# Patient Record
Sex: Female | Born: 1965 | Race: Black or African American | Hispanic: No | Marital: Single | Smoking: Former smoker
Health system: Southern US, Community
[De-identification: ages and names within clinical notes are randomized; demographics above are authoritative.]

## PROBLEM LIST (undated history)

## (undated) DIAGNOSIS — D571 Sickle-cell disease without crisis: Secondary | ICD-10-CM

## (undated) DIAGNOSIS — B379 Candidiasis, unspecified: Secondary | ICD-10-CM

## (undated) DIAGNOSIS — N3090 Cystitis, unspecified without hematuria: Secondary | ICD-10-CM

## (undated) DIAGNOSIS — Z9289 Personal history of other medical treatment: Secondary | ICD-10-CM

## (undated) DIAGNOSIS — J302 Other seasonal allergic rhinitis: Secondary | ICD-10-CM

## (undated) DIAGNOSIS — D573 Sickle-cell trait: Secondary | ICD-10-CM

## (undated) DIAGNOSIS — T7840XA Allergy, unspecified, initial encounter: Secondary | ICD-10-CM

## (undated) DIAGNOSIS — D649 Anemia, unspecified: Secondary | ICD-10-CM

## (undated) DIAGNOSIS — D219 Benign neoplasm of connective and other soft tissue, unspecified: Secondary | ICD-10-CM

## (undated) HISTORY — PX: TUBAL LIGATION: SHX77

## (undated) HISTORY — DX: Anemia, unspecified: D64.9

## (undated) HISTORY — DX: Sickle-cell disease without crisis: D57.1

## (undated) HISTORY — DX: Candidiasis, unspecified: B37.9

## (undated) HISTORY — PX: TONSILLECTOMY AND ADENOIDECTOMY: SHX28

## (undated) HISTORY — PX: WISDOM TOOTH EXTRACTION: SHX21

## (undated) HISTORY — DX: Allergy, unspecified, initial encounter: T78.40XA

## (undated) HISTORY — DX: Cystitis, unspecified without hematuria: N30.90

## (undated) HISTORY — DX: Benign neoplasm of connective and other soft tissue, unspecified: D21.9

## (undated) HISTORY — DX: Sickle-cell trait: D57.3

## (undated) HISTORY — PX: ABDOMINAL HYSTERECTOMY: SHX81

## (undated) HISTORY — PX: EYE MUSCLE SURGERY: SHX370

## (undated) SURGERY — Surgical Case
Anesthesia: *Unknown

---

## 1989-02-26 DIAGNOSIS — Z9289 Personal history of other medical treatment: Secondary | ICD-10-CM

## 1989-02-26 HISTORY — DX: Personal history of other medical treatment: Z92.89

## 1997-10-25 ENCOUNTER — Emergency Department (HOSPITAL_COMMUNITY): Admission: EM | Admit: 1997-10-25 | Discharge: 1997-10-25 | Payer: Self-pay | Admitting: Emergency Medicine

## 1997-10-26 ENCOUNTER — Encounter: Admission: RE | Admit: 1997-10-26 | Discharge: 1997-10-26 | Payer: Self-pay | Admitting: Sports Medicine

## 1999-07-21 ENCOUNTER — Other Ambulatory Visit: Admission: RE | Admit: 1999-07-21 | Discharge: 1999-07-21 | Payer: Self-pay | Admitting: Obstetrics and Gynecology

## 2000-09-11 ENCOUNTER — Encounter: Admission: RE | Admit: 2000-09-11 | Discharge: 2000-09-11 | Payer: Self-pay | Admitting: Family Medicine

## 2000-09-24 ENCOUNTER — Encounter: Admission: RE | Admit: 2000-09-24 | Discharge: 2000-09-24 | Payer: Self-pay | Admitting: Family Medicine

## 2001-04-02 ENCOUNTER — Other Ambulatory Visit: Admission: RE | Admit: 2001-04-02 | Discharge: 2001-04-02 | Payer: Self-pay | Admitting: Obstetrics and Gynecology

## 2002-07-14 ENCOUNTER — Other Ambulatory Visit: Admission: RE | Admit: 2002-07-14 | Discharge: 2002-07-14 | Payer: Self-pay | Admitting: Obstetrics and Gynecology

## 2003-03-15 ENCOUNTER — Encounter: Admission: RE | Admit: 2003-03-15 | Discharge: 2003-03-15 | Payer: Self-pay | Admitting: Family Medicine

## 2003-09-07 ENCOUNTER — Encounter: Admission: RE | Admit: 2003-09-07 | Discharge: 2003-09-07 | Payer: Self-pay | Admitting: Family Medicine

## 2003-09-08 ENCOUNTER — Other Ambulatory Visit: Admission: RE | Admit: 2003-09-08 | Discharge: 2003-09-08 | Payer: Self-pay | Admitting: Obstetrics and Gynecology

## 2003-10-26 ENCOUNTER — Ambulatory Visit (HOSPITAL_COMMUNITY): Admission: RE | Admit: 2003-10-26 | Discharge: 2003-10-26 | Payer: Self-pay | Admitting: Obstetrics and Gynecology

## 2003-10-27 ENCOUNTER — Encounter: Admission: RE | Admit: 2003-10-27 | Discharge: 2003-10-27 | Payer: Self-pay | Admitting: Family Medicine

## 2004-02-22 ENCOUNTER — Emergency Department (HOSPITAL_COMMUNITY): Admission: EM | Admit: 2004-02-22 | Discharge: 2004-02-22 | Payer: Self-pay | Admitting: Family Medicine

## 2004-07-04 ENCOUNTER — Ambulatory Visit: Payer: Self-pay | Admitting: Family Medicine

## 2005-01-16 ENCOUNTER — Other Ambulatory Visit: Admission: RE | Admit: 2005-01-16 | Discharge: 2005-01-16 | Payer: Self-pay | Admitting: Obstetrics and Gynecology

## 2005-03-16 ENCOUNTER — Ambulatory Visit: Payer: Self-pay | Admitting: Family Medicine

## 2005-03-19 ENCOUNTER — Ambulatory Visit: Payer: Self-pay | Admitting: Family Medicine

## 2005-10-01 ENCOUNTER — Ambulatory Visit: Payer: Self-pay | Admitting: Family Medicine

## 2006-08-05 ENCOUNTER — Telehealth (INDEPENDENT_AMBULATORY_CARE_PROVIDER_SITE_OTHER): Payer: Self-pay | Admitting: *Deleted

## 2007-06-23 ENCOUNTER — Encounter (INDEPENDENT_AMBULATORY_CARE_PROVIDER_SITE_OTHER): Payer: Self-pay | Admitting: *Deleted

## 2007-08-31 ENCOUNTER — Emergency Department (HOSPITAL_COMMUNITY): Admission: EM | Admit: 2007-08-31 | Discharge: 2007-08-31 | Payer: Self-pay | Admitting: Family Medicine

## 2008-02-09 ENCOUNTER — Telehealth: Payer: Self-pay | Admitting: *Deleted

## 2008-02-10 ENCOUNTER — Encounter (INDEPENDENT_AMBULATORY_CARE_PROVIDER_SITE_OTHER): Payer: Self-pay | Admitting: Family Medicine

## 2008-02-10 ENCOUNTER — Ambulatory Visit: Payer: Self-pay | Admitting: Family Medicine

## 2008-02-10 LAB — CONVERTED CEMR LAB
Blood in Urine, dipstick: NEGATIVE
Nitrite: NEGATIVE
Protein, U semiquant: NEGATIVE
Urobilinogen, UA: 0.2

## 2008-02-13 ENCOUNTER — Encounter: Payer: Self-pay | Admitting: *Deleted

## 2008-02-16 ENCOUNTER — Ambulatory Visit: Payer: Self-pay | Admitting: Family Medicine

## 2008-02-16 LAB — CONVERTED CEMR LAB
Chlamydia, DNA Probe: POSITIVE — AB
GC Probe Amp, Genital: NEGATIVE
HCV Ab: NEGATIVE
Hepatitis B Surface Ag: NEGATIVE

## 2009-02-05 ENCOUNTER — Telehealth: Payer: Self-pay | Admitting: Family Medicine

## 2010-01-04 ENCOUNTER — Ambulatory Visit: Payer: Self-pay | Admitting: Family Medicine

## 2010-01-04 DIAGNOSIS — R109 Unspecified abdominal pain: Secondary | ICD-10-CM | POA: Insufficient documentation

## 2010-01-06 ENCOUNTER — Encounter: Payer: Self-pay | Admitting: Family Medicine

## 2010-02-13 ENCOUNTER — Telehealth: Payer: Self-pay | Admitting: Family Medicine

## 2010-02-26 DIAGNOSIS — D219 Benign neoplasm of connective and other soft tissue, unspecified: Secondary | ICD-10-CM

## 2010-02-26 HISTORY — DX: Benign neoplasm of connective and other soft tissue, unspecified: D21.9

## 2010-03-28 NOTE — Assessment & Plan Note (Signed)
Summary: cpe,df   Vital Signs:  Patient profile:   45 year old female Height:      63 inches Weight:      129.2 pounds BMI:     22.97 Temp:     98.5 degrees F oral Pulse rate:   98 / minute BP sitting:   130 / 74  (left arm) Cuff size:   regular  Vitals Entered By: Garen Grams LPN (January 04, 2010 10:11 AM) CC: CPE Is Patient Diabetic? No Pain Assessment Patient in pain? no        CC:  CPE.  History of Present Illness: Abdominal Pain  has been told she has fibroids by her gyn.  Had an [place] that she was told showed small fibroids and that she needed a hysterctomy She has almost daiily lower abdominal pain.  Worse sometimes than others.   Has regular menstrual periods and pain does not seem related to them.  Pain sometimes seems to hurt in back also.  No gastrointestinal symptoms of nausea or vomiting weight loss or bleeding.  No fevers or sweats.  ROS - as above PMH - Medications reviewed and updated in medication list.  Smoking Status noted in VS form    Habits & Providers  Alcohol-Tobacco-Diet     Tobacco Status: current     Tobacco Counseling: to quit use of tobacco products     Cigarette Packs/Day: <0.25  Current Medications (verified): 1)  None  Allergies: 1)  ! Penicillin 2)  ! Hydrocodone 3)  ! Tramadol Hcl 4)  ! Darvocet  Social History: Smoking Status:  current Packs/Day:  <0.25  Physical Exam  General:  Well-developed,well-nourished,in no acute distress; alert,appropriate and cooperative throughout examination Abdomen:  Bowel sounds positive,abdomen soft tender over mid lower quadrants with palpable firm mass in midline seems to measure about 6-8 inches in width.  Not pulsatile or fluctuant Extremities:  No clubbing, cyanosis, edema, or deformity noted with normal full range of motion of all joints.  No pain in abdomen with hip movement   Impression & Recommendations:  Problem # 1:  ABDOMINAL PAIN (ICD-789.00)  seems to be consistent with  fibroids except lower abdominal mass is quite large.  Will request [place] results.  Does not have any red flags for gastrointestinal problems or infection.  Recommend she follow up with her gyn for further evaluation and possible treatment of fibroids   Orders: [facility]- Est Level  3 (16109)  Patient Instructions: 1)  I will call you in a few weeks and discuss the Ultrasound 2)  Consider coming back for a cholesterol check 3)  Tobacco is very bad for your health and your loved ones ! You should stop smoking !  4)  Stop smoking tips: Choose a quit date. Cut down before the quit date. Decide what you will do as a substitute when you feel the urge to smoke(gum, toothpick, exercise).    Orders Added: 1)  [facility]- Est Level  3 [60454]

## 2010-03-30 NOTE — Progress Notes (Signed)
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 Additional Follow-up by: De Nurse,  February 14, 2010 4:13 PM    Additional Follow-up for Phone Call Additional follow up Details #2::    Spokw with her reviewed [place] report of multiple fibroids - agreed with Dr Marcelle Overlie that hysterectomy is most likely the best apprpoach Follow-up by: Pearlean Brownie MD,  February 16, 2010 4:15 PM

## 2010-03-31 NOTE — Miscellaneous (Signed)
Summary: ROI  ROI   Imported By: Bradly Bienenstock 01/06/2010 16:51:17  _____________________________________________________________________  External Attachment:    Type:   Image     Comment:   External Document

## 2011-04-23 ENCOUNTER — Ambulatory Visit (INDEPENDENT_AMBULATORY_CARE_PROVIDER_SITE_OTHER): Payer: 59 | Admitting: Family Medicine

## 2011-04-23 ENCOUNTER — Encounter: Payer: Self-pay | Admitting: Family Medicine

## 2011-04-23 VITALS — BP 127/75 | HR 92 | Temp 98.0°F | Ht 63.0 in | Wt 143.0 lb

## 2011-04-23 DIAGNOSIS — Z131 Encounter for screening for diabetes mellitus: Secondary | ICD-10-CM

## 2011-04-23 DIAGNOSIS — D259 Leiomyoma of uterus, unspecified: Secondary | ICD-10-CM

## 2011-04-23 DIAGNOSIS — D219 Benign neoplasm of connective and other soft tissue, unspecified: Secondary | ICD-10-CM

## 2011-04-23 DIAGNOSIS — Z13220 Encounter for screening for lipoid disorders: Secondary | ICD-10-CM

## 2011-04-23 LAB — CBC WITH DIFFERENTIAL/PLATELET
Basophils Absolute: 0 10*3/uL (ref 0.0–0.1)
Basophils Relative: 1 % (ref 0–1)
Eosinophils Absolute: 0.3 10*3/uL (ref 0.0–0.7)
Eosinophils Relative: 7 % — ABNORMAL HIGH (ref 0–5)
Lymphocytes Relative: 40 % (ref 12–46)
MCH: 25.8 pg — ABNORMAL LOW (ref 26.0–34.0)
MCHC: 31.8 g/dL (ref 30.0–36.0)
MCV: 81 fL (ref 78.0–100.0)
Monocytes Relative: 6 % (ref 3–12)
Neutrophils Relative %: 47 % (ref 43–77)
Platelets: 333 10*3/uL (ref 150–400)
RBC: 4.11 MIL/uL (ref 3.87–5.11)
RDW: 16.9 % — ABNORMAL HIGH (ref 11.5–15.5)
WBC: 4.6 10*3/uL (ref 4.0–10.5)

## 2011-04-23 LAB — LIPID PANEL
Cholesterol: 173 mg/dL (ref 0–200)
HDL: 57 mg/dL (ref >39)
LDL Cholesterol: 102 mg/dL — ABNORMAL HIGH (ref 0–99)
Total CHOL/HDL Ratio: 3 Ratio
Triglycerides: 68 mg/dL (ref <150)

## 2011-04-23 LAB — GLUCOSE, CAPILLARY: Glucose-Capillary: 96 mg/dL (ref 70–99)

## 2011-04-23 NOTE — Assessment & Plan Note (Signed)
Following with Dr Marcelle Overlie.  Given her ice craving and likelihood of anemia will check cbc

## 2011-04-23 NOTE — Progress Notes (Signed)
  Subjective:    Patient ID: Sheri Shepard, female    DOB: 04-19-1965, 46 y.o.   MRN: [id number]  HPI  Here for CPE  Except for a mild cold feels well  Fibroids - following with Dr Marcelle Overlie -saw last about a year ago.  Still having abdomen fullness and heavy menstrual periods. Does crave ice  FH - several sibliing have diabetes ,Review of Systems  Patient reports no  vision/ hearing changes,anorexia, weight change, fever ,adenopathy, persistant / recurrent hoarseness, swallowing issues, chest pain, edema,persistant / recurrent cough, hemoptysis, dyspnea(rest, exertional, paroxysmal nocturnal), gastrointestinal  bleeding (melena, rectal bleeding), abdominal pain, excessive heart burn, GU symptoms(dysuria, hematuria, pyuria, voiding/incontinence  Issues) syncope, focal weakness, severe memory loss, concerning skin lesions, depression, anxiety, abnormal bruising/bleeding, major joint swelling    Objective:   Physical Exam Neck:  No deformities, thyromegaly, masses, or tenderness noted.   Supple with full range of motion without pain. Ears:  External ear exam shows no significant lesions or deformities.  Otoscopic examination reveals clear canals, tympanic membranes are intact bilaterally without bulging, retraction, inflammation or discharge. Hearing is grossly normal bilaterall Lungs:  Normal respiratory effort, chest expands symmetrically. Lungs are clear to auscultation, no crackles or wheezes. Heart - Regular rate and rhythm.  No murmurs, gallops or rubs.    Abdomen: soft and non-tender  no organomegaly or hernias noted.  No guarding or rebound.  Does have a large firm mass in suprapubic area consistent with fibroids Extremities:  No cyanosis, edema, or deformity noted with good range of motion of all major joints.   Skin:  Intact without suspicious lesions or rashes  Does have a benign pigmented nevus on upper left chest          Assessment & Plan:    Health Maintenance - will  check lipids and blood sugar.   Advised regular exercise.

## 2011-04-23 NOTE — Patient Instructions (Addendum)
You should exercise for 20-30 min 5 x a week  Your weight is good  I will call you if your tests are not good.  Otherwise I will send you a letter.  If you do not hear from me with in 2 weeks please call our office.     Use Afrin nasal spray for no more than three days to help with congestion

## 2011-04-26 ENCOUNTER — Encounter: Payer: Self-pay | Admitting: Family Medicine

## 2011-04-30 ENCOUNTER — Telehealth: Payer: Self-pay | Admitting: Family Medicine

## 2011-04-30 NOTE — Telephone Encounter (Signed)
Patient got her results on Friday and wants to speak to someone about the results.

## 2011-05-01 NOTE — Telephone Encounter (Signed)
Left voicemail will try to call her later today

## 2011-05-01 NOTE — Telephone Encounter (Signed)
Pt is calling again today - wants to know when doctor will call her

## 2011-05-01 NOTE — Telephone Encounter (Signed)
Spoke with her explained need for iron We got cut off near the end

## 2011-05-01 NOTE — Telephone Encounter (Signed)
Finished explanation and she agreed

## 2011-10-19 ENCOUNTER — Encounter: Payer: Self-pay | Admitting: Obstetrics and Gynecology

## 2011-11-05 ENCOUNTER — Encounter: Payer: Self-pay | Admitting: Obstetrics and Gynecology

## 2011-11-05 ENCOUNTER — Ambulatory Visit (INDEPENDENT_AMBULATORY_CARE_PROVIDER_SITE_OTHER): Payer: 59 | Admitting: Obstetrics and Gynecology

## 2011-11-05 VITALS — BP 122/66 | Ht 63.0 in | Wt 152.0 lb

## 2011-11-05 DIAGNOSIS — D219 Benign neoplasm of connective and other soft tissue, unspecified: Secondary | ICD-10-CM | POA: Insufficient documentation

## 2011-11-05 DIAGNOSIS — D259 Leiomyoma of uterus, unspecified: Secondary | ICD-10-CM

## 2011-11-05 DIAGNOSIS — D573 Sickle-cell trait: Secondary | ICD-10-CM | POA: Insufficient documentation

## 2011-11-05 DIAGNOSIS — N3090 Cystitis, unspecified without hematuria: Secondary | ICD-10-CM

## 2011-11-05 DIAGNOSIS — B49 Unspecified mycosis: Secondary | ICD-10-CM

## 2011-11-05 DIAGNOSIS — B379 Candidiasis, unspecified: Secondary | ICD-10-CM | POA: Insufficient documentation

## 2011-11-05 NOTE — Progress Notes (Signed)
  Current contraception: tubal ligation. Hormone replacement therapy: No New medication: No  History of ZOX:WRUE  History of infertility: no. History of abnormal Pap smear: no  Due October 2013 History of fibroids: Yes 2012  Increased stress: No  Abnormal bleeding pattern started: pt states that she is having severe pain with periods. Lower abdominal pain, cramps in legs and nausea. States that she was dx. With fibroids in 2012. Pt states that Dr. Marcelle Overlie with [facility] recommended partial hysterectomy.   Subjective:    Sheri Shepard is a 46 y.o. female, [phone], who presents for worsening dysmenorrhea since June 2013, onset 1-2 days prior to bleeding, lasting the whole cycle. Intensity 8/10: Ibuprofen 800 mg = 6/10. Pain radiate to legs. Associated with nausea but no vomiting. Non-smoker. Previous BCP user: nausea. No DVT.  Cycle is monthly, 5-7 days, heavy flow for 6 days: 1 pad every 90 minutes, no clots. No BTB. No PCB. Deep dyspareunia with all IC. Known with anemia.  Pt desires robotic hysterectomy. 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  The following portions of the patient's history were reviewed and updated as appropriate: allergies, current medications, past family history.  Review of Systems Pertinent items are noted in HPI. Breast:Negative for breast lump,nipple discharge or nipple retraction Gastrointestinal: Negative for abdominal pain, change in bowel habits or rectal bleeding. Occasional constipation. Urinary: frequency, incomplete emptying. No dysuria. Occasional USI with sneezing.   Objective:    BP 122/66  Ht 5\' 3"  (1.6 m)  Wt 152 lb (68.947 kg)  BMI 26.93 kg/m2  LMP 10/18/2011    Weight:  Wt Readings from Last 1 Encounters:  11/05/11 152 lb (68.947 kg)          BMI: Body mass index is 26.93 kg/(m^2).  General Appearance: Alert, appropriate appearance for age. No acute distress HEENT: Grossly normal Neck / Thyroid: Supple, no  masses, nodes or enlargement Lungs: clear to auscultation bilaterally Back: No CVA tenderness Cardiovascular: Regular rate and rhythm. S1, S2, no murmur Gastrointestinal: Uterus 22-24 weeks and tender on right Pelvic Exam: Uterus: enlarged and non mobile Rectovaginal: not indicated    Assessment:    Symptomatic fibroids in pt desiring robotic hysterectomy    Plan:    Pelvic ultrasound ordered Info given on Depo-Lupron

## 2011-11-05 NOTE — Patient Instructions (Signed)
Robotic hysterectomy Depo-Lupron for fibroids

## 2011-11-06 ENCOUNTER — Telehealth: Payer: Self-pay | Admitting: Obstetrics and Gynecology

## 2011-11-06 MED ORDER — PROMETHAZINE HCL 25 MG PO TABS: 25.0000 mg | ORAL_TABLET | Freq: Four times a day (QID) | ORAL | Status: DC | PRN

## 2011-11-06 NOTE — Telephone Encounter (Signed)
Pt requesting something for nausea. Sent Phenergan 25 mg, 1 q 6hr PRN nausea.  #30 no refills per nausea protocol (SR)  ld

## 2011-11-21 ENCOUNTER — Ambulatory Visit (INDEPENDENT_AMBULATORY_CARE_PROVIDER_SITE_OTHER): Payer: 59 | Admitting: Obstetrics and Gynecology

## 2011-11-21 ENCOUNTER — Ambulatory Visit (INDEPENDENT_AMBULATORY_CARE_PROVIDER_SITE_OTHER): Payer: 59

## 2011-11-21 ENCOUNTER — Encounter: Payer: Self-pay | Admitting: Obstetrics and Gynecology

## 2011-11-21 VITALS — BP 110/72 | Wt 152.0 lb

## 2011-11-21 DIAGNOSIS — D259 Leiomyoma of uterus, unspecified: Secondary | ICD-10-CM

## 2011-11-21 DIAGNOSIS — M549 Dorsalgia, unspecified: Secondary | ICD-10-CM

## 2011-11-21 DIAGNOSIS — D219 Benign neoplasm of connective and other soft tissue, unspecified: Secondary | ICD-10-CM

## 2011-11-21 NOTE — Patient Instructions (Signed)
Depo-Lupron Robotic hysterectomy

## 2011-11-21 NOTE — Progress Notes (Signed)
Subjective:  Pt stated having some lower back pain and thin clear discharge . Pt stated no other issues today.   Sheri Shepard is a 46 y.o. female, [phone], who presents for Gyn ultrasound because of Fibroids. Pt desires robotic hysterectomy  The following portions of the patient's history were reviewed and updated as appropriate: allergies, current medications, past family history.  Objective:    BP 110/72  Wt 152 lb (68.947 kg)  LMP 10/18/2011    Weight:  Wt Readings from Last 1 Encounters:  11/21/11 152 lb (68.947 kg)          BMI: There is no height on file to calculate BMI.  ULTRASOUND: Uterus 12x11x8 cm    Adnexa normal x2    Endometrium 8  mm    Free fluid: no    Other findings:  5 measurable fibroids with largest at 7 cm left subserosal. No pedunculated.  Abdomen: 20 weeks size Pelvic: Uterus non-mobile about 20 weeks    Assessment:    symptomatic fibroids    Plan:    Recommend depo-lupron for 3-6 months to increase chances of success Will use add-back and follow-up with ultrasound to determine need for 2nd injection

## 2011-11-22 ENCOUNTER — Telehealth: Payer: Self-pay | Admitting: Obstetrics and Gynecology

## 2011-11-22 NOTE — Telephone Encounter (Signed)
sr pt 

## 2011-11-23 ENCOUNTER — Telehealth: Payer: Self-pay | Admitting: Obstetrics and Gynecology

## 2011-11-23 ENCOUNTER — Encounter: Payer: Self-pay | Admitting: Obstetrics and Gynecology

## 2011-11-23 MED ORDER — LEUPROLIDE ACETATE (3 MONTH) 11.25 MG IM KIT: 11.2500 mg | PACK | INTRAMUSCULAR | Status: DC

## 2011-11-23 NOTE — Progress Notes (Unsigned)
RX for Depot Lupron called to [place] at [facility]  [phone]. Pt to be notified regarding copay. Office to be notified prior to shipping. Per Dr SR injection  to be administered with menses and negative UPT.  Pt to have add-back Angelique. Notify Dr SR after 1st dose. To have U/S in 12 weeks at time of next injection which may be cancelled. Surgery to follow treatment.

## 2011-11-23 NOTE — Telephone Encounter (Signed)
TC to pt. Informed to expect call regarding copay for Lupron. Once approved, to call 1st day of menses to schedule appt. Pt verbalizes comprehension.

## 2011-11-23 NOTE — Telephone Encounter (Signed)
TC to pt. Explained that a dip urine was done but no culture. Pt agreeable.  To call if symptoms reoccur.  ld

## 2011-11-23 NOTE — Telephone Encounter (Signed)
Message copied by Mason Jim on Fri Nov 23, 2011  9:55 AM ------      Message from: Silverio Lay      Created: Wed Nov 21, 2011  4:14 PM      Regarding: Depo-Lupron       Depo-Lupron Orders            Dosage:  11.25 mg  to give  With cycle q 12 weeks x 2            Diagnosis: large symptomatic fibroids            UPT: yes            Add-back hormonotherapy: yes. Provide with Angeliq            Follow-up appointment: yes Needs ultrasound at 12 weeks on day of second injection which may be cancelled            Surgery to follow treatment: yes            Please send message back to Dr Estanislado Pandy when First dose given to patient

## 2011-11-23 NOTE — Telephone Encounter (Signed)
TC to pt. LM to return call.  

## 2011-11-27 ENCOUNTER — Other Ambulatory Visit: Payer: Self-pay | Admitting: Obstetrics and Gynecology

## 2011-11-27 DIAGNOSIS — D219 Benign neoplasm of connective and other soft tissue, unspecified: Secondary | ICD-10-CM

## 2011-12-05 ENCOUNTER — Telehealth: Payer: Self-pay | Admitting: Obstetrics and Gynecology

## 2011-12-05 NOTE — Telephone Encounter (Signed)
Returned pt's call. LM to return call.   

## 2011-12-05 NOTE — Telephone Encounter (Signed)
Ns to follow up

## 2011-12-06 NOTE — Telephone Encounter (Signed)
Returned pt's call. States has not heard for pharmacy regarding copay.  Tc to [facility] at [phone]. States awaiting Dx code.  Dx of Fibroids given again. Pt to be notified of copay.  TC to pt. Informed of above. Pt verbalizes comprehension.

## 2011-12-10 ENCOUNTER — Telehealth: Payer: Self-pay | Admitting: Obstetrics and Gynecology

## 2011-12-10 NOTE — Telephone Encounter (Signed)
TC from pt.  States has still not heard about status of Lupron. LMP today.  Phone numbers given to pt for [facility] for pt  to call. Also states is taking Ibuprofen 800 mg 1-2 times during menses but does not totally relieve pain.   Suggested take q 8 hours.  To call if no relief.  Pt verbalizes comprehension.

## 2011-12-10 NOTE — Telephone Encounter (Signed)
Returned pt's call.  States she spoke with [facility] at [phone].  Has made payment arrangements and wants Lupron shipped. Tc to [facility] at same #. Rx will be shipped 12/11/11 and arrive 12/12/11. Tc to pt. LM

## 2011-12-10 NOTE — Telephone Encounter (Signed)
Returned pt's call. LM to return call.   

## 2011-12-11 ENCOUNTER — Other Ambulatory Visit: Payer: 59

## 2011-12-13 ENCOUNTER — Other Ambulatory Visit (INDEPENDENT_AMBULATORY_CARE_PROVIDER_SITE_OTHER): Payer: 59

## 2011-12-13 DIAGNOSIS — D259 Leiomyoma of uterus, unspecified: Secondary | ICD-10-CM

## 2011-12-13 MED ORDER — LEUPROLIDE ACETATE (3 MONTH) 11.25 MG IM KIT
11.2500 mg | PACK | Freq: Once | INTRAMUSCULAR | Status: AC
  Administered 2011-12-13: 11.25 mg via INTRAMUSCULAR

## 2011-12-13 NOTE — Progress Notes (Unsigned)
Informed pt that Depo Lupron can cause bone loss.  Inst pt to take Calcium and gave pt samples and coupons.

## 2011-12-31 ENCOUNTER — Telehealth: Payer: Self-pay | Admitting: Obstetrics and Gynecology

## 2011-12-31 NOTE — Telephone Encounter (Signed)
Pt called regarding msg below.  States received the injection on 10/17 and was on cycle @ the time.  Pt wants to know if she stopped her cycle does the medication cause bleeding again.  Informed pt that the Lupron can cause her to have irregular bldg initially.  Pt denies having to change a soaked pad Qhr, states her bldg is actually better than it was before.  Per SR note, will have pt come back into office for an Korea to evaluate and if needed will get the 2nd injection, pt voices agreement.  Will call back with any concerns.

## 2012-01-07 ENCOUNTER — Telehealth: Payer: Self-pay | Admitting: Obstetrics and Gynecology

## 2012-01-07 NOTE — Telephone Encounter (Signed)
sr pt 

## 2012-01-07 NOTE — Telephone Encounter (Signed)
Pt questioning when she will be due for f/u u/s.  According to note,  Depo Lupron for 3-6 months.  Pt had inj on 12-13-11.  Will route to Sr for instruction.  ld

## 2012-01-09 NOTE — Telephone Encounter (Signed)
Please schedule ultrasound 12 weeks after her Depo-Lupron injection

## 2012-01-15 ENCOUNTER — Other Ambulatory Visit: Payer: Self-pay | Admitting: Obstetrics and Gynecology

## 2012-01-15 DIAGNOSIS — D219 Benign neoplasm of connective and other soft tissue, unspecified: Secondary | ICD-10-CM

## 2012-01-15 NOTE — Telephone Encounter (Signed)
Tc TO pt.  Per Dr SR scheduled for U/S and F/U 03/05/12 which will be 12 weeks after receiving Lupron.

## 2012-01-15 NOTE — Telephone Encounter (Signed)
Did you get this?

## 2012-02-13 ENCOUNTER — Encounter: Payer: Self-pay | Admitting: Family Medicine

## 2012-02-13 ENCOUNTER — Ambulatory Visit (INDEPENDENT_AMBULATORY_CARE_PROVIDER_SITE_OTHER): Payer: 59 | Admitting: Family Medicine

## 2012-02-13 VITALS — BP 143/92 | HR 102 | Temp 98.5°F | Ht 63.0 in | Wt 154.0 lb

## 2012-02-13 DIAGNOSIS — D229 Melanocytic nevi, unspecified: Secondary | ICD-10-CM | POA: Insufficient documentation

## 2012-02-13 DIAGNOSIS — D239 Other benign neoplasm of skin, unspecified: Secondary | ICD-10-CM

## 2012-02-13 NOTE — Patient Instructions (Addendum)
Follow up with Dr Marcelle Overlie for the Pap Smear  If you would like to have the mole removed then come back   Watch the calories and rich foods over the holidays  Consider trying to lose about 2 lbs a week to get back to your normal weight

## 2012-02-13 NOTE — Progress Notes (Signed)
  Subjective:    Patient ID: Sheri Shepard, female    DOB: 06-May-1965, 46 y.o.   MRN: [id number]  HPI  Mole Wants to discuss pros and cons of removal.  Has been on her left chest for years - no changing in size or bleeding or color.  Does occasionally itch and will hit the seatbelt sometime  No fhx of skin cancer   Review of Systems     Objective:   Physical Exam  Left anterior chest black homogenous in color stuck on appearing mole approximately 8 mm in diameter with clear well defined borders      Assessment & Plan:

## 2012-02-13 NOTE — Assessment & Plan Note (Signed)
No red flags other than size which has not been changing.   We discussed a simple shave removal and check to make sure is not cancer.  She would like to think about it and reschedule if she decides to have it remvoed.  Discussed if any changes it should be removed as could be cancer

## 2012-02-25 ENCOUNTER — Ambulatory Visit (INDEPENDENT_AMBULATORY_CARE_PROVIDER_SITE_OTHER): Payer: 59 | Admitting: Family Medicine

## 2012-02-25 ENCOUNTER — Encounter: Payer: Self-pay | Admitting: Family Medicine

## 2012-02-25 VITALS — BP 155/79 | HR 97 | Temp 98.6°F | Ht 63.0 in | Wt 153.7 lb

## 2012-02-25 DIAGNOSIS — J069 Acute upper respiratory infection, unspecified: Secondary | ICD-10-CM

## 2012-02-25 NOTE — Progress Notes (Signed)
Subjective:   1. URI symptoms-patient with 4 days of sore throat, headache, cough, nasal congestion and rhinorrhea, and body aches. Symptoms seemed to peak/plateau yesterday. May have had fever over weekend and has occasional chill. No nausea/vomiting. Does have some decreased appetite. Body aches most annoying symptom. Using tylenol cold OR Dayquil and occasional motrin and cough drops. Has tried heating pad and rest over the weekend but went to work today and started to feel worse when pushing herself. Positive sick contact-son with viral illness.   ROS--See HPI with addition of some right ear fullness Past Medical History . Sickle cell trait  Overall generally healthy, did not receive flu vaccine this year and does not desire  Medications- reviewed and updated Chief complaint-noted  Objective: BP 155/79  Pulse 97  Temp 98.6 F (37 C)  Ht 5\' 3"  (1.6 m)  Wt 153 lb 11.2 oz (69.718 kg)  BMI 27.23 kg/m2  LMP 12/14/2011 Gen: NAD, overweight, pleasant Eyes: conjunctivae and cornea slightly red, PERRLA, EOMI.  Ears: normal TM and external ear canal left ear and abnormal TM right ear - serous middle ear fluid Nose: clear discharge, mild congestion, turbinates red, swollen, no sinus tenderness Throat: lips, mucosa, and tongue normal; teeth and gums normal Neck: no adenopathy and supple, symmetrical, trachea midline CV: RRR no mrg Lungs: CTAB, no wheeze, rale, rhonchi Ext: no edema  Assessment/Plan: See problem oriented charted  Elevated BP-patient using decongestants which couldbe cause for elevated BP but noted to be high at last visit. WIll have patient check at home and follow up with Dr. Deirdre Priest if elevated.

## 2012-02-25 NOTE — Patient Instructions (Addendum)
I am sorry you arent feeling well. I think you have an upper respiratory infection with associated body aches.  Since the body aches are bothering you the most, I would like you to take 400mg  Motrin every 6 hours while awake. The heating pad is great idea as well. Use tylenol cold as needed but do not use anything else with acetaminophen or tylenol.  I would like you to rest for the next 2 days, you should slowly continue to improve but symptoms from a cold can last up to 2 weeks. If you are notn improving, please return to care here.   Your blood pressure was up again today. When you are feeling better and not using cold medicine, check it at home or [facility] and if above 140/90 please return to a visit with Dr. Deirdre Priest.   Thanks so much for your time,  Dr. Durene Cal

## 2012-02-26 DIAGNOSIS — J069 Acute upper respiratory infection, unspecified: Secondary | ICD-10-CM | POA: Insufficient documentation

## 2012-02-26 NOTE — Assessment & Plan Note (Addendum)
Viral illness with positive sick contact. No red flags. Symptomatic treatment per AVS including rest. GIven work note for 2 days.

## 2012-03-05 ENCOUNTER — Encounter: Payer: 59 | Admitting: Obstetrics and Gynecology

## 2012-03-05 ENCOUNTER — Encounter: Payer: Self-pay | Admitting: Obstetrics and Gynecology

## 2012-03-05 ENCOUNTER — Ambulatory Visit (INDEPENDENT_AMBULATORY_CARE_PROVIDER_SITE_OTHER): Payer: 59

## 2012-03-05 ENCOUNTER — Ambulatory Visit (INDEPENDENT_AMBULATORY_CARE_PROVIDER_SITE_OTHER): Payer: 59 | Admitting: Obstetrics and Gynecology

## 2012-03-05 ENCOUNTER — Other Ambulatory Visit: Payer: Self-pay | Admitting: Obstetrics and Gynecology

## 2012-03-05 VITALS — BP 100/60 | Ht 63.0 in | Wt 154.0 lb

## 2012-03-05 DIAGNOSIS — D259 Leiomyoma of uterus, unspecified: Secondary | ICD-10-CM

## 2012-03-05 DIAGNOSIS — D219 Benign neoplasm of connective and other soft tissue, unspecified: Secondary | ICD-10-CM

## 2012-03-05 NOTE — Progress Notes (Signed)
Subjective:    Sheri Shepard is a 47 y.o. female, [phone], who presents for Gyn ultrasound because of Fibroids S/P Lupron. Amenorrhea since October with complete resolution of pelvic pain and back pain. Did not use add-back. Reports hot flashes and night sweats that are tolerable.  The following portions of the patient's history were reviewed and updated as appropriate: allergies, current medications, past family history.  Objective:    LMP 12/14/2011    Weight:  Wt Readings from Last 1 Encounters:  02/25/12 153 lb 11.2 oz (69.718 kg)          BMI: There is no height or weight on file to calculate BMI.  ULTRASOUND: Uterus Enlarged 10.7 x 9 x 6 cm   From 11/2011: 12 x 11 x 8 cm    Adnexa Normal    Endometrium 0.543 mm    Free fluid: no    Other findings:  Multiple fibroids, measured in same order as previous scan for comparison: Fibroid 1. Posterior RT fundal: 4.4 x 4.6 x 4.6 cm  unchanged Fibroid 2. Anterior RT fundal: 3.6 x 3.5 x 4.1 cm  unchanged Fibroid 3. Left subserosal: 6.4 x 4.4 x 5.5 cm  Decreased from 8.2 cm Fibroid 4. Posterior Intramural: 2.5 x 1.7 x 2.2 cm  Decreased from 4.7. cm Fibroid 5. Anterior Intramural: 2.4 x 2.4 x 2.5 cm  Decreased from 3.2 cm  Exam: abdomen with fundal height at 0/2   Assessment:    Fibroids with some reduction in volume after 3 months of Depo-Lupron    Plan:    Repeat Depo-Lupron Schedule robotic hysterectomy mid- April Follow-up at pre-op appointment with ultrasound   Silverio Lay MD

## 2012-03-10 ENCOUNTER — Telehealth: Payer: Self-pay | Admitting: Obstetrics and Gynecology

## 2012-03-10 NOTE — Telephone Encounter (Signed)
Advised pt that this is normal. States that she has spoke with company regarding Depo Lupron inj. And we are just waiting for it to be delivered to office  [name], CMA

## 2012-03-10 NOTE — Telephone Encounter (Signed)
LM for pt to return call  [name], CMA

## 2012-03-12 ENCOUNTER — Ambulatory Visit: Payer: 59 | Admitting: Family Medicine

## 2012-03-14 ENCOUNTER — Encounter: Payer: 59 | Admitting: Obstetrics and Gynecology

## 2012-03-18 ENCOUNTER — Telehealth: Payer: Self-pay | Admitting: Obstetrics and Gynecology

## 2012-03-19 NOTE — Telephone Encounter (Signed)
sr pt 

## 2012-03-20 NOTE — Telephone Encounter (Signed)
Spoke with [facility] [phone]) states that they are sending Depo-Lupron inj [employer], shipping today. Should receive tomorrow 03/21/2012.  Pt voiced understanding   [name], CMA

## 2012-03-27 ENCOUNTER — Telehealth: Payer: Self-pay | Admitting: Obstetrics and Gynecology

## 2012-03-27 NOTE — Telephone Encounter (Signed)
sr pt 

## 2012-03-27 NOTE — Telephone Encounter (Signed)
Pt scheduled for 03/28/12 @ 3:00  [name], CMA

## 2012-03-28 ENCOUNTER — Other Ambulatory Visit: Payer: 59

## 2012-03-28 DIAGNOSIS — N912 Amenorrhea, unspecified: Secondary | ICD-10-CM

## 2012-03-28 LAB — POCT URINE PREGNANCY: Preg Test, Ur: NEGATIVE

## 2012-03-28 MED ORDER — LEUPROLIDE ACETATE (3 MONTH) 11.25 MG IM KIT
11.2500 mg | PACK | Freq: Once | INTRAMUSCULAR | Status: AC
  Administered 2012-03-28: 11.25 mg via INTRAMUSCULAR

## 2012-04-30 ENCOUNTER — Encounter: Payer: Self-pay | Admitting: Family Medicine

## 2012-04-30 ENCOUNTER — Ambulatory Visit (INDEPENDENT_AMBULATORY_CARE_PROVIDER_SITE_OTHER): Payer: 59 | Admitting: Family Medicine

## 2012-04-30 VITALS — BP 137/85 | HR 87 | Temp 98.2°F | Ht 63.0 in | Wt 156.0 lb

## 2012-04-30 DIAGNOSIS — M25519 Pain in unspecified shoulder: Secondary | ICD-10-CM | POA: Insufficient documentation

## 2012-04-30 DIAGNOSIS — M25512 Pain in left shoulder: Secondary | ICD-10-CM

## 2012-04-30 MED ORDER — IBUPROFEN 400 MG PO TABS: 400.0000 mg | ORAL_TABLET | Freq: Four times a day (QID) | ORAL | Status: DC | PRN

## 2012-04-30 NOTE — Progress Notes (Signed)
Subjective:     Patient ID: Sheri Shepard, female   DOB: Feb 02, 1966, 47 y.o.   MRN: [id number]  Shoulder Pain  The pain is present in the left shoulder. This is a new problem. The current episode started 1 to 4 weeks ago (C/o left shoulder pain for 3 wks or more). There has been a history of trauma (She fell 2 wks ago ago on the snow,but already hurting before she fell,but it made pain worse). The problem has been waxing and waning (last episode was last night). The quality of the pain is described as sharp. Pain scale: Today she does not have any pain,but whenever she has pain it is about 7/10. The pain is moderate. Associated symptoms include stiffness and tingling. Pertinent negatives include no fever, inability to bear weight, joint swelling or numbness. The symptoms are aggravated by activity (Combing her hair aggravates her pain). She has tried NSAIDS for the symptoms. The treatment provided no relief.   Past Medical History  Diagnosis Date   Yeast infection    Fibroids 2012   Sickle cell trait    Bladder infection      Review of Systems  Constitutional: Negative for fever.  Respiratory: Negative.   Cardiovascular: Negative.   Musculoskeletal: Positive for arthralgias and stiffness.       Shoulder pain  Neurological: Positive for tingling. Negative for numbness.  All other systems reviewed and are negative.   Filed Vitals:   04/30/12 0838  BP: 137/85  Pulse: 87  Temp: 98.2 F (36.8 C)  TempSrc: Oral  Height: 5\' 3"  (1.6 m)  Weight: 156 lb (70.761 kg)       Objective:   Physical Exam  Nursing note and vitals reviewed. Constitutional: She is oriented to person, place, and time. She appears well-developed. No distress.  Cardiovascular: Normal rate, regular rhythm, normal heart sounds and intact distal pulses.   No murmur heard. Pulmonary/Chest: Effort normal and breath sounds normal. No respiratory distress. She has no wheezes.  Musculoskeletal:       Right  shoulder: Normal. She exhibits normal range of motion and no tenderness.       Left shoulder: She exhibits tenderness. She exhibits normal range of motion.       Arms: Neurological: She is alert and oriented to person, place, and time.       Assessment/Plan:

## 2012-04-30 NOTE — Assessment & Plan Note (Signed)
Left shoulder pain. Musculoskeletal pain secondary to past trauma.May also be related to Lupron. Symptom improving currently asymptomatic. She had completed her last dose of Lupron,patient is to monitor for pain improvement. Ibuprofen 400 mg prescribed prn pain. She has F/U with dr Deirdre Priest in 2 wks.

## 2012-04-30 NOTE — Patient Instructions (Signed)
Sheri Shepard, Lotter do cause joint disorder and bone lose over a period of time,but you have only been on this for a short duration.  Let us have you use Ibuprofen prn shoulder pain for now.  Since you already had your last dose of Leuprone,let us monitor for pain improvement.  F/U in 2-3 wks for reassessment.

## 2012-05-14 ENCOUNTER — Encounter: Payer: 59 | Admitting: Family Medicine

## 2012-06-27 ENCOUNTER — Encounter (HOSPITAL_COMMUNITY): Payer: Self-pay | Admitting: Pharmacist

## 2012-06-27 ENCOUNTER — Other Ambulatory Visit (HOSPITAL_COMMUNITY): Payer: Self-pay | Admitting: Obstetrics and Gynecology

## 2012-06-27 NOTE — H&P (Signed)
Sheri Shepard is a 47 y.o. female [phone]  for Robot Assisted Hysterectomy because of symptomatic uterine fibroids.  Patient has known of her fibroids for years but a year ago she developed bothersome symptoms that became life altering.  Her 7 day menses required a pad change every 90 minutes accompanied by cramps (rated 10/10 on a 10 point pain scale) that radiated to her back and down her legs.  Fortunately she was able to get some relief with Ibuprofen decreasing her pain to 6/10.  She goes on to report dyspareunia and constipation but denies any urinary tract symptoms, irregular bleeding or vaginitis symptoms.  A pelvic ultrasound in September 2013 showed: uterus-12 x 11 x 8 cm with #5 fibroids the largest measuring 7 cm.   She was placed on Lupron Depot for 3 months resulting in  relief of her pelvic discomfort and amenorrhea.  A pelvic ultrasound, January 2014 showed: uterus-10.7 x 9.0 x 6.0 cm with #5 fibroids: post. right fundal-4.4 x 4.6 x 4.6 cm; ant. right fundal-3.6 x 3.5 x 4.1 cm; left subserosal-6.4 x 4.4 x 5.5 cm (decrease from 8.2 cm); post. intramural-2.5 x 1.7 x 2.2 cm (decrease from 4.7 cm) and ant. intramural-2.4 x 2.4 x 2.5 cm. Follow up ultrasound 06/26/2012 showed uterus: 9.84 x 7.88 x 8.13 cm, endometrium-3.56 mm and #4 fibroids: post. fundal-3.6 x 4.8 x 4.5 cm; ant. fundal-3.0 x 3.4 x 3.5 cm; left subserosal-6.3 x 5.3 x 5.9 cm and ant. intramural-1.9 x 2.2 x 2.1 cm;  the previously seen 5th fibroid was not visible. Right ovary measured-2.52 x 1.96 x 1.36 cm and left ovary-2.44 x 1.81 x 1.60 cm.   The patient has had some occasional constipation and nausea, but no bladder, vaginitis or irregular bleeding symptoms.  A discussion was held with patient to review medical and surgical management options available for managing her fibroids.  After long and careful consideration she has decided to proceed with definitive therapy in the form of  a Robot Assisted Hysterectomy due to her  commitments requiring her to recover as soon as possible   Past Medical History  OB History: [id number] ;  #3 C-sections, 1988, 1991 and 1994  GYN History: menarche: 47 YO;  [id number] due to Lupron Depot    Contracepton: Tubal Sterilization; Has a remote history of Chlamydia;    Denies history of abnormal PAP smear  Last PAP smear: 02/2012  Medical History: anemia and uterine fibroids  Surgical History:  Childhood-Eye Muscle Surgery;  1989-Tonsillectomy and 1994-Tubal Sterilization Denies problems with anesthesia but had a  blood transfusion during 1991 pregnancy  Family History:  Diabetes mellitus, renal failure, stroke, anemia  Social History: 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 Tramadol Hcl Shortness Of Breath and Itching  . Hydrocodone Itching  . Penicillins Hives and Itching  . Propoxyphene-Acetaminophen Itching    Denies sensitivity to peanuts, shellfish, soy, latex or adhesives.  ROS: Admits to wearing glasses and aches in shoulders and legs;  Denies headache, vision changes, nasal congestion, dysphagia, tinnitus, dizziness, hoarseness, cough,  chest pain, shortness of breath, nausea, vomiting, diarrhea,constipation,  urinary frequency, urgency  dysuria, hematuria, vaginitis symptoms, pelvic pain, swelling of joints,easy bruising, skin rashes, unexplained weight loss and except as is mentioned in the history of present illness, patient's review of systems is otherwise negative  Physical Exam  Bp  124/90  P 80  R  14  Weight: 164lbs.   Height: 5'3"  T  97.8 degrees F orally  Neck: supple without masses or thyromegaly Lungs: clear to auscultation Heart: regular rate and rhythm Abdomen: firm mass from pelvis to approximately 4 fingers below umbilicus and no  organomegaly Pelvic:EGBUS- wnl; vagina-normal rugae; uterus-16-18 weeks size, irregular, cervix without lesions or motion tenderness; adnexae-no tenderness or masses Extremities:  no clubbing, cyanosis or edema   Assesment: Symptomatic Uterine Fibroids   Disposition:  The patient was given the indication for her procedure(s) along with the risks, which include but are not limited to, reaction to anesthesia, damage to adjacent organs, Infection, excessive bleeding, formation of scar tissue, early menopause, pelvic prolapse and the possible need for an open abdominal incision. She was further advised that she will experience transient post operative facial edema, that her hospital stay is expected to be 0-2 days, she should be able to return to her usual activities within 2-3 weeks (except intercourse to be delayed until 6 weeks) and that the robotic approach to her surgery requires more time to perform than an open abdominal approach.  Patient was given the Miralax bowel prep to be completed 24 hours prior to procedure.  She verbalized understanding of these risks and pre-operative instructions and has consented to proceed with a Robot Assisted Total Laparoscopic Hysterectomy  with Bilateral Salpingectomy, possible Supra-Cervical Hysterectomy at [facility] [place],  Jul 10, 2012 at 7:30 A.Judie Petit  CSN# [id number]   Sheri Shepard J. Lowell Guitar, PA-C  for Dr. Crist Fat.Rivard

## 2012-07-03 ENCOUNTER — Encounter (HOSPITAL_COMMUNITY): Payer: Self-pay

## 2012-07-03 ENCOUNTER — Encounter (HOSPITAL_COMMUNITY)
Admission: RE | Admit: 2012-07-03 | Discharge: 2012-07-03 | Disposition: A | Discharge status: Home / Self Care | Payer: 59 | Source: Ambulatory Visit | Attending: Obstetrics and Gynecology | Admitting: Obstetrics and Gynecology

## 2012-07-03 HISTORY — DX: Personal history of other medical treatment: Z92.89

## 2012-07-03 HISTORY — DX: Other seasonal allergic rhinitis: J30.2

## 2012-07-03 LAB — CBC
HCT: 38.8 % (ref 36.0–46.0)
Hemoglobin: 13.1 g/dL (ref 12.0–15.0)
MCH: 28.5 pg (ref 26.0–34.0)
MCHC: 33.8 g/dL (ref 30.0–36.0)
MCV: 84.5 fL (ref 78.0–100.0)
Platelets: 319 10*3/uL (ref 150–400)
RBC: 4.59 MIL/uL (ref 3.87–5.11)
RDW: 13.4 % (ref 11.5–15.5)

## 2012-07-03 LAB — SURGICAL PCR SCREEN: MRSA, PCR: NEGATIVE

## 2012-07-03 NOTE — Patient Instructions (Addendum)
   Your procedure is scheduled on: Thursday, May 15  Enter through the Main Entrance of [facility] at: 6 am Pick up the phone at the desk and dial [phone] and inform us of your arrival.  Please call this number if you have any problems the morning of surgery: [phone]  Remember: Do not eat or drink after midnight: Wednesday Take these medicines the morning of surgery with a SIP OF WATER:  None  Do not wear jewelry, make-up, or FINGER nail polish No metal in your hair or on your body. Do not wear lotions, powders, perfumes. You may wear deodorant.  Please use your CHG wash as directed prior to surgery.  Do not shave anywhere for at least 12 hours prior to first CHG shower.  Do not bring valuables to the hospital. Contacts, dentures or bridgework may not be worn into surgery.  Leave suitcase in the car. After Surgery it may be brought to your room. For patients being admitted to the hospital, checkout time is 11:00am the day of discharge.  Patients discharged on the day of surgery will not be allowed to drive home.  To be arranged prior to surgery.

## 2012-07-09 ENCOUNTER — Telehealth: Payer: Self-pay | Admitting: Obstetrics and Gynecology

## 2012-07-09 MED ORDER — CIPROFLOXACIN IN D5W 400 MG/200ML IV SOLN
400.0000 mg | INTRAVENOUS | Status: AC
  Administered 2012-07-10: 400 mg via INTRAVENOUS
  Filled 2012-07-09: qty 200

## 2012-07-09 MED ORDER — CLINDAMYCIN PHOSPHATE 900 MG/50ML IV SOLN
900.0000 mg | INTRAVENOUS | Status: AC
  Administered 2012-07-10: 900 mg via INTRAVENOUS
  Filled 2012-07-09: qty 50

## 2012-07-09 NOTE — Telephone Encounter (Signed)
Patient called and informed about FDA warning on use of morcellator dated 06/12/2012.  Discussed: 1. Incidence of post-operative diagnosis of sarcoma in women undergoing hysterectomy is 2:1000 2. Annual Incidence of leiomyosarcoma is 0.64/100,000 women 3. Sarcomas have a highest incidence in women over 65 4. Power morcellation involves risk of spreading tissue. In the case of undiagnosed cancer, it may adversely affect the patient's prognosis. 5. There is no conclusive evidence that manual morcellation during an abdominal and / or vaginal  Hysterectomy eliminates those risks  Patient voices understanding and desires to proceed as planned

## 2012-07-10 ENCOUNTER — Ambulatory Visit (HOSPITAL_COMMUNITY)
Admission: RE | Admit: 2012-07-10 | Discharge: 2012-07-11 | Disposition: A | Discharge status: Home / Self Care | Payer: 59 | Source: Ambulatory Visit | Attending: Obstetrics and Gynecology | Admitting: Obstetrics and Gynecology

## 2012-07-10 ENCOUNTER — Encounter (HOSPITAL_COMMUNITY): Payer: Self-pay | Admitting: *Deleted

## 2012-07-10 ENCOUNTER — Encounter (HOSPITAL_COMMUNITY): Payer: Self-pay | Admitting: Anesthesiology

## 2012-07-10 ENCOUNTER — Ambulatory Visit (HOSPITAL_COMMUNITY): Payer: 59 | Admitting: Anesthesiology

## 2012-07-10 ENCOUNTER — Encounter (HOSPITAL_COMMUNITY)
Admission: RE | Disposition: A | Discharge status: Home / Self Care | Payer: Self-pay | Source: Ambulatory Visit | Attending: Obstetrics and Gynecology

## 2012-07-10 DIAGNOSIS — N72 Inflammatory disease of cervix uteri: Secondary | ICD-10-CM | POA: Insufficient documentation

## 2012-07-10 DIAGNOSIS — D251 Intramural leiomyoma of uterus: Secondary | ICD-10-CM | POA: Insufficient documentation

## 2012-07-10 DIAGNOSIS — D250 Submucous leiomyoma of uterus: Secondary | ICD-10-CM | POA: Insufficient documentation

## 2012-07-10 DIAGNOSIS — N840 Polyp of corpus uteri: Secondary | ICD-10-CM | POA: Insufficient documentation

## 2012-07-10 DIAGNOSIS — IMO0002 Reserved for concepts with insufficient information to code with codable children: Secondary | ICD-10-CM | POA: Insufficient documentation

## 2012-07-10 HISTORY — PX: ROBOTIC ASSISTED TOTAL HYSTERECTOMY: SHX6085

## 2012-07-10 LAB — COMPREHENSIVE METABOLIC PANEL
AST: 19 U/L (ref 0–37)
BUN: 7 mg/dL (ref 6–23)
CO2: 26 mEq/L (ref 19–32)
Calcium: 10 mg/dL (ref 8.4–10.5)
Creatinine, Ser: 0.67 mg/dL (ref 0.50–1.10)
GFR calc non Af Amer: >90 mL/min (ref >90)

## 2012-07-10 LAB — PREGNANCY, URINE: Preg Test, Ur: NEGATIVE

## 2012-07-10 SURGERY — ROBOTIC ASSISTED TOTAL HYSTERECTOMY
Anesthesia: General | Site: Abdomen | Laterality: Bilateral | Wound class: Clean Contaminated

## 2012-07-10 MED ORDER — FENTANYL CITRATE 0.05 MG/ML IJ SOLN
INTRAMUSCULAR | Status: AC
  Filled 2012-07-10: qty 2

## 2012-07-10 MED ORDER — PROPOFOL 10 MG/ML IV EMUL
INTRAVENOUS | Status: AC
  Filled 2012-07-10: qty 20

## 2012-07-10 MED ORDER — LIDOCAINE HCL (CARDIAC) 20 MG/ML IV SOLN
INTRAVENOUS | Status: DC | PRN
  Administered 2012-07-10: 100 mg via INTRAVENOUS

## 2012-07-10 MED ORDER — FENTANYL CITRATE 0.05 MG/ML IJ SOLN
INTRAMUSCULAR | Status: DC | PRN
  Administered 2012-07-10 (×5): 50 ug via INTRAVENOUS

## 2012-07-10 MED ORDER — GLYCOPYRROLATE 0.2 MG/ML IJ SOLN
INTRAMUSCULAR | Status: DC | PRN
  Administered 2012-07-10: .5 mg via INTRAVENOUS

## 2012-07-10 MED ORDER — MIDAZOLAM HCL 5 MG/5ML IJ SOLN
INTRAMUSCULAR | Status: DC | PRN
  Administered 2012-07-10: 2 mg via INTRAVENOUS

## 2012-07-10 MED ORDER — DEXAMETHASONE SODIUM PHOSPHATE 10 MG/ML IJ SOLN
INTRAMUSCULAR | Status: AC
  Filled 2012-07-10: qty 1

## 2012-07-10 MED ORDER — DEXAMETHASONE SODIUM PHOSPHATE 4 MG/ML IJ SOLN
INTRAMUSCULAR | Status: DC | PRN
  Administered 2012-07-10: 10 mg via INTRAVENOUS

## 2012-07-10 MED ORDER — KETOROLAC TROMETHAMINE 30 MG/ML IJ SOLN
INTRAMUSCULAR | Status: DC | PRN
  Administered 2012-07-10: 30 mg via INTRAVENOUS

## 2012-07-10 MED ORDER — ROPIVACAINE HCL 5 MG/ML IJ SOLN
INTRAMUSCULAR | Status: DC | PRN
  Administered 2012-07-10: 120 mL

## 2012-07-10 MED ORDER — PROPOFOL 10 MG/ML IV BOLUS
INTRAVENOUS | Status: DC | PRN
  Administered 2012-07-10: 160 mg via INTRAVENOUS

## 2012-07-10 MED ORDER — HYDROCODONE-ACETAMINOPHEN 5-325 MG PO TABS: 1.0000 | ORAL_TABLET | Freq: Four times a day (QID) | ORAL | Status: DC | PRN

## 2012-07-10 MED ORDER — ONDANSETRON HCL 4 MG/2ML IJ SOLN
INTRAMUSCULAR | Status: AC
  Filled 2012-07-10: qty 2

## 2012-07-10 MED ORDER — MENTHOL 3 MG MT LOZG
1.0000 | LOZENGE | OROMUCOSAL | Status: DC | PRN
  Administered 2012-07-10: 3 mg via ORAL
  Filled 2012-07-10: qty 9

## 2012-07-10 MED ORDER — IBUPROFEN 600 MG PO TABS: 600.0000 mg | ORAL_TABLET | Freq: Four times a day (QID) | ORAL | Status: DC | PRN

## 2012-07-10 MED ORDER — LACTATED RINGERS IV SOLN
INTRAVENOUS | Status: DC
  Administered 2012-07-10: 1000 mL via INTRAVENOUS
  Administered 2012-07-10 (×2): via INTRAVENOUS

## 2012-07-10 MED ORDER — ONDANSETRON HCL 4 MG PO TABS: 4.0000 mg | ORAL_TABLET | Freq: Three times a day (TID) | ORAL | Status: DC | PRN

## 2012-07-10 MED ORDER — METOCLOPRAMIDE HCL 5 MG/ML IJ SOLN
10.0000 mg | Freq: Once | INTRAMUSCULAR | Status: DC | PRN
Start: 2012-07-10 — End: 2012-07-10

## 2012-07-10 MED ORDER — FENTANYL CITRATE 0.05 MG/ML IJ SOLN
INTRAMUSCULAR | Status: AC
  Filled 2012-07-10: qty 5

## 2012-07-10 MED ORDER — ACETAMINOPHEN 10 MG/ML IV SOLN
INTRAVENOUS | Status: AC
  Administered 2012-07-10: 1000 mg via INTRAVENOUS
  Filled 2012-07-10: qty 100

## 2012-07-10 MED ORDER — LACTATED RINGERS IV SOLN
INTRAVENOUS | Status: DC
  Administered 2012-07-10: 17:00:00 via INTRAVENOUS

## 2012-07-10 MED ORDER — ONDANSETRON HCL 4 MG/2ML IJ SOLN
INTRAMUSCULAR | Status: DC | PRN
  Administered 2012-07-10: 4 mg via INTRAVENOUS

## 2012-07-10 MED ORDER — LIDOCAINE HCL (CARDIAC) 20 MG/ML IV SOLN
INTRAVENOUS | Status: AC
  Filled 2012-07-10: qty 5

## 2012-07-10 MED ORDER — NEOSTIGMINE METHYLSULFATE 1 MG/ML IJ SOLN
INTRAMUSCULAR | Status: DC | PRN
  Administered 2012-07-10: 3 mg via INTRAVENOUS

## 2012-07-10 MED ORDER — ROCURONIUM BROMIDE 50 MG/5ML IV SOLN
INTRAVENOUS | Status: AC
  Filled 2012-07-10: qty 1

## 2012-07-10 MED ORDER — ACETAMINOPHEN 10 MG/ML IV SOLN
1000.0000 mg | Freq: Once | INTRAVENOUS | Status: AC
  Administered 2012-07-10: 1000 mg via INTRAVENOUS

## 2012-07-10 MED ORDER — IBUPROFEN 600 MG PO TABS
600.0000 mg | ORAL_TABLET | Freq: Four times a day (QID) | ORAL | Status: DC | PRN
  Administered 2012-07-11: 600 mg via ORAL
  Filled 2012-07-10: qty 1

## 2012-07-10 MED ORDER — KETOROLAC TROMETHAMINE 30 MG/ML IJ SOLN
INTRAMUSCULAR | Status: AC
  Filled 2012-07-10: qty 1

## 2012-07-10 MED ORDER — GLYCOPYRROLATE 0.2 MG/ML IJ SOLN
INTRAMUSCULAR | Status: AC
  Filled 2012-07-10: qty 2

## 2012-07-10 MED ORDER — ROPIVACAINE HCL 5 MG/ML IJ SOLN
INTRAMUSCULAR | Status: AC
  Filled 2012-07-10: qty 60

## 2012-07-10 MED ORDER — BUPIVACAINE HCL (PF) 0.25 % IJ SOLN
INTRAMUSCULAR | Status: AC
  Filled 2012-07-10: qty 30

## 2012-07-10 MED ORDER — LACTATED RINGERS IR SOLN
Status: DC | PRN
  Administered 2012-07-10: 3000 mL

## 2012-07-10 MED ORDER — KETOROLAC TROMETHAMINE 30 MG/ML IJ SOLN: 15.0000 mg | Freq: Once | INTRAMUSCULAR | Status: DC | PRN

## 2012-07-10 MED ORDER — MIDAZOLAM HCL 2 MG/2ML IJ SOLN
INTRAMUSCULAR | Status: AC
  Filled 2012-07-10: qty 2

## 2012-07-10 MED ORDER — NEOSTIGMINE METHYLSULFATE 1 MG/ML IJ SOLN
INTRAMUSCULAR | Status: AC
  Filled 2012-07-10: qty 1

## 2012-07-10 MED ORDER — HYDROMORPHONE HCL 2 MG PO TABS: 2.0000 mg | ORAL_TABLET | Freq: Four times a day (QID) | ORAL | Status: DC | PRN

## 2012-07-10 MED ORDER — FENTANYL CITRATE 0.05 MG/ML IJ SOLN: 25.0000 ug | INTRAMUSCULAR | Status: DC | PRN

## 2012-07-10 MED ORDER — ROCURONIUM BROMIDE 100 MG/10ML IV SOLN
INTRAVENOUS | Status: DC | PRN
  Administered 2012-07-10: 50 mg via INTRAVENOUS
  Administered 2012-07-10 (×2): 10 mg via INTRAVENOUS
  Administered 2012-07-10: 20 mg via INTRAVENOUS

## 2012-07-10 MED ORDER — KETOROLAC TROMETHAMINE 30 MG/ML IJ SOLN
30.0000 mg | Freq: Four times a day (QID) | INTRAMUSCULAR | Status: DC
  Administered 2012-07-10 (×2): 30 mg via INTRAVENOUS
  Filled 2012-07-10 (×2): qty 1

## 2012-07-10 MED ORDER — HYDROMORPHONE HCL PF 1 MG/ML IJ SOLN
INTRAMUSCULAR | Status: DC | PRN
  Administered 2012-07-10: 1 mg via INTRAVENOUS

## 2012-07-10 MED ORDER — HYDROMORPHONE HCL PF 1 MG/ML IJ SOLN
INTRAMUSCULAR | Status: AC
  Filled 2012-07-10: qty 1

## 2012-07-10 SURGICAL SUPPLY — 66 items
BAG URINE DRAINAGE (UROLOGICAL SUPPLIES) ×2 IMPLANT
BARRIER ADHS 3X4 INTERCEED (GAUZE/BANDAGES/DRESSINGS) ×2 IMPLANT
BENZOIN TINCTURE PRP APPL 2/3 (GAUZE/BANDAGES/DRESSINGS) IMPLANT
BLADE SURG 15 STRL LF C SS BP (BLADE) ×1 IMPLANT
BLADE SURG 15 STRL SS (BLADE) ×1
CATH FOLEY 3WAY  5CC 16FR (CATHETERS) ×2
CATH FOLEY 3WAY  5CC 18FR (CATHETERS) ×1
CATH FOLEY 3WAY 5CC 16FR (CATHETERS) ×2 IMPLANT
CATH FOLEY 3WAY 5CC 18FR (CATHETERS) ×1 IMPLANT
CHLORAPREP W/TINT 26ML (MISCELLANEOUS) ×2 IMPLANT
CLOTH BEACON ORANGE TIMEOUT ST (SAFETY) ×2 IMPLANT
CONT PATH 16OZ SNAP LID 3702 (MISCELLANEOUS) ×2 IMPLANT
CORDS BIPOLAR (ELECTRODE) IMPLANT
COVER MAYO STAND STRL (DRAPES) ×2 IMPLANT
COVER TABLE BACK 60X90 (DRAPES) ×4 IMPLANT
COVER TIP SHEARS 8 DVNC (MISCELLANEOUS) ×1 IMPLANT
COVER TIP SHEARS 8MM DA VINCI (MISCELLANEOUS) ×1
DECANTER SPIKE VIAL GLASS SM (MISCELLANEOUS) ×2 IMPLANT
DERMABOND ADVANCED (GAUZE/BANDAGES/DRESSINGS) ×1
DERMABOND ADVANCED .7 DNX12 (GAUZE/BANDAGES/DRESSINGS) ×1 IMPLANT
DRAPE HUG U DISPOSABLE (DRAPE) ×2 IMPLANT
DRAPE LG THREE QUARTER DISP (DRAPES) ×4 IMPLANT
DRAPE WARM FLUID 44X44 (DRAPE) ×2 IMPLANT
ELECT REM PT RETURN 9FT ADLT (ELECTROSURGICAL) ×2
ELECTRODE REM PT RTRN 9FT ADLT (ELECTROSURGICAL) ×1 IMPLANT
EVACUATOR SMOKE 8.L (FILTER) ×2 IMPLANT
GAUZE VASELINE 3X9 (GAUZE/BANDAGES/DRESSINGS) IMPLANT
GLOVE BIOGEL PI IND STRL 7.0 (GLOVE) ×2 IMPLANT
GLOVE BIOGEL PI INDICATOR 7.0 (GLOVE) ×2
GLOVE ECLIPSE 6.5 STRL STRAW (GLOVE) ×6 IMPLANT
GOWN STRL REIN XL XLG (GOWN DISPOSABLE) ×12 IMPLANT
GRASPER BIPOLAR FEN DA VINCI (INSTRUMENTS)
GRASPER BPLR FEN DVNC (INSTRUMENTS) IMPLANT
KIT ACCESSORY DA VINCI DISP (KITS) ×1
KIT ACCESSORY DVNC DISP (KITS) ×1 IMPLANT
LEGGING LITHOTOMY PAIR STRL (DRAPES) ×2 IMPLANT
NEEDLE HYPO 22GX1.5 SAFETY (NEEDLE) ×2 IMPLANT
OCCLUDER COLPOPNEUMO (BALLOONS) ×4 IMPLANT
PACK LAVH (CUSTOM PROCEDURE TRAY) ×2 IMPLANT
PAD PREP 24X48 CUFFED NSTRL (MISCELLANEOUS) ×4 IMPLANT
PLUG CATH AND CAP STER (CATHETERS) ×2 IMPLANT
PROTECTOR NERVE ULNAR (MISCELLANEOUS) ×4 IMPLANT
SET CYSTO W/LG BORE CLAMP LF (SET/KITS/TRAYS/PACK) IMPLANT
SET IRRIG TUBING LAPAROSCOPIC (IRRIGATION / IRRIGATOR) ×2 IMPLANT
SOLUTION ELECTROLUBE (MISCELLANEOUS) ×2 IMPLANT
STRIP CLOSURE SKIN 1/2X4 (GAUZE/BANDAGES/DRESSINGS) IMPLANT
SUT MNCRL AB 3-0 PS2 27 (SUTURE) ×2 IMPLANT
SUT VIC AB 0 CT1 27 (SUTURE) ×6
SUT VIC AB 0 CT1 27XBRD ANBCTR (SUTURE) ×2 IMPLANT
SUT VIC AB 0 CT1 27XBRD ANTBC (SUTURE) ×4 IMPLANT
SUT VICRYL 0 UR6 27IN ABS (SUTURE) ×4 IMPLANT
SYR 50ML LL SCALE MARK (SYRINGE) ×2 IMPLANT
SYSTEM CONVERTIBLE TROCAR (TROCAR) ×2 IMPLANT
TIP RUMI ORANGE 6.7MMX12CM (TIP) IMPLANT
TIP UTERINE 5.1X6CM LAV DISP (MISCELLANEOUS) IMPLANT
TIP UTERINE 6.7X10CM GRN DISP (MISCELLANEOUS) ×2 IMPLANT
TIP UTERINE 6.7X6CM WHT DISP (MISCELLANEOUS) IMPLANT
TIP UTERINE 6.7X8CM BLUE DISP (MISCELLANEOUS) IMPLANT
TOWEL OR 17X24 6PK STRL BLUE (TOWEL DISPOSABLE) ×6 IMPLANT
TROCAR 12M 150ML BLUNT (TROCAR) ×2 IMPLANT
TROCAR DISP BLADELESS 8 DVNC (TROCAR) ×1 IMPLANT
TROCAR DISP BLADELESS 8MM (TROCAR) ×1
TROCAR XCEL 12X100 BLDLESS (ENDOMECHANICALS) ×2 IMPLANT
TUBING FILTER THERMOFLATOR (ELECTROSURGICAL) ×2 IMPLANT
WARMER LAPAROSCOPE (MISCELLANEOUS) ×2 IMPLANT
WATER STERILE IRR 1000ML POUR (IV SOLUTION) ×6 IMPLANT

## 2012-07-10 NOTE — Progress Notes (Signed)
Pt states she has no ride home and no one at home to care for her tonight as she thought she was spending the night at the hospital.  Dr. Estanislado Pandy notified and stated OK to d/c home in am.

## 2012-07-10 NOTE — Transfer of Care (Signed)
Immediate Anesthesia Transfer of Care Note  Patient: Sheri Shepard  Procedure(s) Performed: Procedure(s): ROBOTIC ASSISTED TOTAL HYSTERECTOMY WITH BILATERAL SALPINGECTOMY (Bilateral)  Patient Location: PACU  Anesthesia Type:General  Level of Consciousness: awake and alert   Airway & Oxygen Therapy: Patient Spontanous Breathing and Patient connected to nasal cannula oxygen  Post-op Assessment: Report given to PACU RN and Post -op Vital signs reviewed and stable  Post vital signs: Reviewed and stable  Complications: No apparent anesthesia complications

## 2012-07-10 NOTE — Interval H&P Note (Signed)
History and Physical Interval Note:  07/10/2012 7:29 AM  Sheri Shepard  has presented today for surgery, with the diagnosis of Fibroids; CPT [phone]  The various methods of treatment have been discussed with the patient and family. After consideration of risks, benefits and other options for treatment, the patient has consented to  Procedure(s) with comments: ROBOTIC ASSISTED TOTAL HYSTERECTOMY (N/A) - Robot Assisted Hysterectomy, Possible Supravervical as a surgical intervention .  The patient's history has been reviewed, patient examined, no change in status, stable for surgery.  I have reviewed the patient's chart and labs.  Questions were answered to the patient's satisfaction.     [name]

## 2012-07-10 NOTE — Preoperative (Signed)
Beta Blockers   Reason not to administer Beta Blockers:Not Applicable 

## 2012-07-10 NOTE — Anesthesia Preprocedure Evaluation (Signed)
Anesthesia Evaluation  Patient identified by MRN, date of birth, ID band Patient awake    Reviewed: Allergy & Precautions, H&P , NPO status , Patient's Chart, lab work & pertinent test results, reviewed documented beta blocker date and time   History of Anesthesia Complications Negative for: history of anesthetic complications  Airway Mallampati: I TM Distance: >3 FB Neck ROM: full    Dental  (+) Teeth Intact   Pulmonary former smoker (quit 2012),  breath sounds clear to auscultation  Pulmonary exam normal       Cardiovascular Exercise Tolerance: Good negative cardio ROS  Rhythm:regular Rate:Normal     Neuro/Psych negative neurological ROS  negative psych ROS   GI/Hepatic negative GI ROS, Neg liver ROS,   Endo/Other  negative endocrine ROS  Renal/GU negative Renal ROS  Female GU complaint     Musculoskeletal   Abdominal   Peds  Hematology  (+) Sickle cell trait ,   Anesthesia Other Findings   Reproductive/Obstetrics negative OB ROS                           Anesthesia Physical Anesthesia Plan  ASA: II  Anesthesia Plan: General ETT   Post-op Pain Management:    Induction:   Airway Management Planned:   Additional Equipment:   Intra-op Plan:   Post-operative Plan:   Informed Consent: I have reviewed the patients History and Physical, chart, labs and discussed the procedure including the risks, benefits and alternatives for the proposed anesthesia with the patient or authorized representative who has indicated his/her understanding and acceptance.   Dental Advisory Given  Plan Discussed with: CRNA and Surgeon  Anesthesia Plan Comments:         Anesthesia Quick Evaluation

## 2012-07-10 NOTE — H&P (Signed)
Sheri Shepard is a 46 y.o. female G4P3013 46  for Robot Assisted Hysterectomy because of symptomatic uterine fibroids.  Patient has known of her fibroids for years but a year ago she developed bothersome symptoms that became life altering.  Her 7 day menses required a pad change every 90 minutes accompanied by cramps (rated 10/10 on a 10 point pain scale) that radiated to her back and down her legs.  Fortunately she was able to get some relief with Ibuprofen decreasing her pain to 6/10.  She goes on to report dyspareunia and constipation but denies any urinary tract symptoms, irregular bleeding or vaginitis symptoms.  A pelvic ultrasound in September 2013 showed: uterus-12 x 11 x 8 cm with #5 fibroids the largest measuring 7 cm.   She was placed on Lupron Depot for 3 months resulting in  relief of her pelvic discomfort and amenorrhea.  A pelvic ultrasound, January 2014 showed: uterus-10.7 x 9.0 x 6.0 cm with #5 fibroids: post. right fundal-4.4 x 4.6 x 4.6 cm; ant. right fundal-3.6 x 3.5 x 4.1 cm; left subserosal-6.4 x 4.4 x 5.5 cm (decrease from 8.2 cm); post. intramural-2.5 x 1.7 x 2.2 cm (decrease from 4.7 cm) and ant. intramural-2.4 x 2.4 x 2.5 cm. Follow up ultrasound 06/26/2012 showed uterus: 9.84 x 7.88 x 8.13 cm, endometrium-3.56 mm and #4 fibroids: post. fundal-3.6 x 4.8 x 4.5 cm; ant. fundal-3.0 x 3.4 x 3.5 cm; left subserosal-6.3 x 5.3 x 5.9 cm and ant. intramural-1.9 x 2.2 x 2.1 cm;  the previously seen 5th fibroid was not visible. Right ovary measured-2.52 x 1.96 x 1.36 cm and left ovary-2.44 x 1.81 x 1.60 cm.   The patient has had some occasional constipation and nausea, but no bladder, vaginitis or irregular bleeding symptoms.  A discussion was held with patient to review medical and surgical management options available for managing her fibroids.  After long and careful consideration she has decided to proceed with definitive therapy in the form of  a Robot Assisted Hysterectomy due to her  commitments requiring her to recover as soon as possible   Past Medical History  OB History: G4P3013 ;  #3 C-sections, 1988, 1991 and 1994  GYN History: menarche: 47 YO;  LMP:amenorrheic due to Lupron Depot    Contracepton: Tubal Sterilization; Has a remote history of Chlamydia;    Denies history of abnormal PAP smear  Last PAP smear: 02/2012  Medical History: anemia and uterine fibroids  Surgical History:  Childhood-Eye Muscle Surgery;  1989-Tonsillectomy and 1994-Tubal Sterilization Denies problems with anesthesia but had a  blood transfusion during 1991 pregnancy  Family History:  Diabetes mellitus, renal failure, stroke, anemia  Social History: Single and employed in [employer]; Denies tobacco or illicit drug use but occasionally consumes alcohol   Medications:  Ibuprofen 400 mg prn Multi-vitamins daily  Allergies  Allergen Reactions  . Tramadol Hcl Shortness Of Breath and Itching  . Hydrocodone Itching  . Penicillins Hives and Itching  . Propoxyphene-Acetaminophen Itching    Denies sensitivity to peanuts, shellfish, soy, latex or adhesives.  ROS: Admits to wearing glasses and aches in shoulders and legs;  Denies headache, vision changes, nasal congestion, dysphagia, tinnitus, dizziness, hoarseness, cough,  chest pain, shortness of breath, nausea, vomiting, diarrhea,constipation,  urinary frequency, urgency  dysuria, hematuria, vaginitis symptoms, pelvic pain, swelling of joints,easy bruising, skin rashes, unexplained weight loss and except as is mentioned in the history of present illness, patient's review of systems is otherwise negative     Physical Exam  Bp  124/90  P 80  R  14  Weight: 164lbs.   Height: 5'3"  T  97.8 degrees F orally  Neck: supple without masses or thyromegaly Lungs: clear to auscultation Heart: regular rate and rhythm Abdomen: firm mass from pelvis to approximately 4 fingers below umbilicus and no  organomegaly Pelvic:EGBUS- wnl; vagina-normal rugae; uterus-16-18 weeks size, irregular, cervix without lesions or motion tenderness; adnexae-no tenderness or masses Extremities:  no clubbing, cyanosis or edema   Assesment: Symptomatic Uterine Fibroids   Disposition:  The patient was given the indication for her procedure(s) along with the risks, which include but are not limited to, reaction to anesthesia, damage to adjacent organs, Infection, excessive bleeding, formation of scar tissue, early menopause, pelvic prolapse and the possible need for an open abdominal incision. She was further advised that she will experience transient post operative facial edema, that her hospital stay is expected to be 0-2 days, she should be able to return to her usual activities within 2-3 weeks (except intercourse to be delayed until 6 weeks) and that the robotic approach to her surgery requires more time to perform than an open abdominal approach.  Patient was given the Miralax bowel prep to be completed 24 hours prior to procedure.  She verbalized understanding of these risks and pre-operative instructions and has consented to proceed with a Robot Assisted Total Laparoscopic Hysterectomy  with Bilateral Salpingectomy, possible Supra-Cervical Hysterectomy at [facility] ,  Jul 10, 2012 at 7:30 A.M.  CSN# [id number]   Sheri Ploch J. Honest Vanleer, PA-C  for Dr. Sandra A.Rivard   

## 2012-07-10 NOTE — H&P (View-Only) (Signed)
Sheri Shepard is a 47 y.o. female G4P3013 47  for Robot Assisted Hysterectomy because of symptomatic uterine fibroids.  Patient has known of her fibroids for years but a year ago she developed bothersome symptoms that became life altering.  Her 7 day menses required a pad change every 90 minutes accompanied by cramps (rated 10/10 on a 10 point pain scale) that radiated to her back and down her legs.  Fortunately she was able to get some relief with Ibuprofen decreasing her pain to 6/10.  She goes on to report dyspareunia and constipation but denies any urinary tract symptoms, irregular bleeding or vaginitis symptoms.  A pelvic ultrasound in September 2013 showed: uterus-12 x 11 x 8 cm with #5 fibroids the largest measuring 7 cm.   She was placed on Lupron Depot for 3 months resulting in  relief of her pelvic discomfort and amenorrhea.  A pelvic ultrasound, January 2014 showed: uterus-10.7 x 9.0 x 6.0 cm with #5 fibroids: post. right fundal-4.4 x 4.6 x 4.6 cm; ant. right fundal-3.6 x 3.5 x 4.1 cm; left subserosal-6.4 x 4.4 x 5.5 cm (decrease from 8.2 cm); post. intramural-2.5 x 1.7 x 2.2 cm (decrease from 4.7 cm) and ant. intramural-2.4 x 2.4 x 2.5 cm. Follow up ultrasound 06/26/2012 showed uterus: 9.84 x 7.88 x 8.13 cm, endometrium-3.56 mm and #4 fibroids: post. fundal-3.6 x 4.8 x 4.5 cm; ant. fundal-3.0 x 3.4 x 3.5 cm; left subserosal-6.3 x 5.3 x 5.9 cm and ant. intramural-1.9 x 2.2 x 2.1 cm;  the previously seen 5th fibroid was not visible. Right ovary measured-2.52 x 1.96 x 1.36 cm and left ovary-2.44 x 1.81 x 1.60 cm.   The patient has had some occasional constipation and nausea, but no bladder, vaginitis or irregular bleeding symptoms.  A discussion was held with patient to review medical and surgical management options available for managing her fibroids.  After long and careful consideration she has decided to proceed with definitive therapy in the form of  a Robot Assisted Hysterectomy due to her  commitments requiring her to recover as soon as possible   Past Medical History  OB History: G4P3013 ;  #3 C-sections, 1988, 1991 and 1994  GYN History: menarche: 47 YO;  LMP:amenorrheic due to Lupron Depot    Contracepton: Tubal Sterilization; Has a remote history of Chlamydia;    Denies history of abnormal PAP smear  Last PAP smear: 02/2012  Medical History: anemia and uterine fibroids  Surgical History:  Childhood-Eye Muscle Surgery;  1989-Tonsillectomy and 1994-Tubal Sterilization Denies problems with anesthesia but had a  blood transfusion during 1991 pregnancy  Family History:  Diabetes mellitus, renal failure, stroke, anemia  Social History: Single and employed in [employer]; Denies tobacco or illicit drug use but occasionally consumes alcohol   Medications:  Ibuprofen 400 mg prn Multi-vitamins daily  Allergies  Allergen Reactions  . Tramadol Hcl Shortness Of Breath and Itching  . Hydrocodone Itching  . Penicillins Hives and Itching  . Propoxyphene-Acetaminophen Itching    Denies sensitivity to peanuts, shellfish, soy, latex or adhesives.  ROS: Admits to wearing glasses and aches in shoulders and legs;  Denies headache, vision changes, nasal congestion, dysphagia, tinnitus, dizziness, hoarseness, cough,  chest pain, shortness of breath, nausea, vomiting, diarrhea,constipation,  urinary frequency, urgency  dysuria, hematuria, vaginitis symptoms, pelvic pain, swelling of joints,easy bruising, skin rashes, unexplained weight loss and except as is mentioned in the history of present illness, patient's review of systems is otherwise negative     Physical Exam  Bp  124/90  P 80  R  14  Weight: 164lbs.   Height: 5'3"  T  97.8 degrees F orally  Neck: supple without masses or thyromegaly Lungs: clear to auscultation Heart: regular rate and rhythm Abdomen: firm mass from pelvis to approximately 4 fingers below umbilicus and no  organomegaly Pelvic:EGBUS- wnl; vagina-normal rugae; uterus-16-18 weeks size, irregular, cervix without lesions or motion tenderness; adnexae-no tenderness or masses Extremities:  no clubbing, cyanosis or edema   Assesment: Symptomatic Uterine Fibroids   Disposition:  The patient was given the indication for her procedure(s) along with the risks, which include but are not limited to, reaction to anesthesia, damage to adjacent organs, Infection, excessive bleeding, formation of scar tissue, early menopause, pelvic prolapse and the possible need for an open abdominal incision. She was further advised that she will experience transient post operative facial edema, that her hospital stay is expected to be 0-2 days, she should be able to return to her usual activities within 2-3 weeks (except intercourse to be delayed until 6 weeks) and that the robotic approach to her surgery requires more time to perform than an open abdominal approach.  Patient was given the Miralax bowel prep to be completed 24 hours prior to procedure.  She verbalized understanding of these risks and pre-operative instructions and has consented to proceed with a Robot Assisted Total Laparoscopic Hysterectomy  with Bilateral Salpingectomy, possible Supra-Cervical Hysterectomy at [facility],  Jul 10, 2012 at 7:30 A.M.  CSN# [id number]   Darlette Dubow J. Draco Malczewski, PA-C  for Dr. Sandra A.Rivard   

## 2012-07-10 NOTE — Discharge Summary (Signed)
Physician Discharge Summary  Patient ID: Sheri Shepard MRN: [id number] DOB/AGE: 09/10/1965 47 y.o.  Admit date: 07/10/2012 Discharge date: 07/10/2012   Discharge Diagnoses:  Symptomatic Uterine Fibroids Active Problems:   * No active hospital problems. *   Operation: Robot Assisted Total Laparoscopic Hysterectomy with Bilateral Salpingectomy   Discharged Condition: Good  Hospital Course: On the date of admission the patient underwent the aforementioned procedures and tolerated them well.  Post operative course was unremarkable with the patient resuming bowel and bladder function by post operative day #1 and was therefore deemed ready for discharge home.  Disposition: Home self care  Discharge Medications:    Medication List    ASK your doctor about these medications       CALTRATE 600+D PO  Take 2 tablets by mouth daily.     multivitamin with minerals tablet  Take 1 tablet by mouth daily.      Dilaudid 2 mg 1 tablet every 6 hours as needed for moderate pain Zofran 4 mg every 8 hours as needed for nausea Ibuprofen 600 mg with food, every 6 hours as needed for pain    Follow-up: Dr. Estanislado Pandy,  Jul 24, 2012 at 9:10 a.m.  SignedHenreitta Leber, PA-C 07/10/2012, 12:25 PM

## 2012-07-10 NOTE — Op Note (Signed)
Preoperative diagnosis: Symptomatic uterine fibroids with 3 previous cesarean sections  Postoperative diagnosis: Same with pelvic adhesions  Anesthesia: Gen.  Anesthesiologist: Dr. Arby Barrette  Procedure: Robotically assisted total hysterectomy with bilateral salpingectomy  Surgeon: Dr. Dois Davenport Samanatha Brammer  Assistant: Henreitta Leber P.A.-C.  Estimated blood loss 50 cc  Procedure:  After being informed of the planned procedure with possible complications including bleeding, infection, injury to other organs, risk of requiring laparotomy, potential risks associated with the use of power morcellator, expected hospital stay and recovery, informed consent was obtained and patient was taken room #6. She was placed in the lithotomy position on a sticky mattress and beanbag with arms padded and tucked on each side and knee-high sequential compressive devices. She was given general anesthesia with endotracheal intubation without any complication. She was prepped and draped in a sterile fashion.  Pelvic exam reveals an anteverted enlarged and irregular uterus compatible with 18 weeks size with good mobility. A weighted speculum is inserted in the vagina and anterior lip of the cervix is grasped with a tenaculum forcep. We perform a paracervical block using 5 cc of ropivacaine 0.5% diluted with 5 cc of saline. The uterus is sounded at 10 cm. The cervix was easily dilated using Hegar dilator until #31 which allows for easy placement of a #10 RUMI intrauterine manipulator with a vaginal occluder and a 3.0 KOH ring. The intrauterine manipulator is balloon is inflated with 10 cc of saline.  The KOH ring is sutured to the cervix with 1-0 Vicryl suture.retractors are removed. A three-way Foley catheter is inserted in the bladder.  We measure 12 cm of the above the fundus of the uterus and infiltrate the skin with 3 cc of ropivacaine 0.5%. We then infiltrate the fascia with 7 cc of ropivacaine 0.5%. We perform a vertical  incision which is brought down bluntly to the fascia. The fascia is identified and grasped with Coker forceps and incised with Mayo scissors. Peritoneum is entered bluntly. A pursestring suture of 0 Vicryl is placed on the fascia and a 12 mm Hassan trocar is easily inserted in the abdominal cavity held in place by the pursestring suture. This allows for easy insufflation of the pneumoperitoneum using warm to CO2 at a maximum pressure of 15 mm mercury. The camera is inserted for a quick visualization of the pelvis with the following findings: Uterus is enlarged with multiple fibroids the dominant one being anterior and measuring 8 cm. Anterior cul-de-sac is normal. Posterior cul-de-sac is normal. We see adhesions between the omentum and the anterior abdominal wall. She is status bilateral tubal ligation. Both ovaries and distal tubes are normal. We then positioned the patient in reverse Trendelenburg and perform a pelvic washings with 30 cc of ropivacaine 0.5% diluted with 30 cc of saline. This will remain in the pelvis throughout the procedure. We then decide are trocar placement. We position to 8 mm robotic trocar on the left, 1 8mm robotic trocar on the right and one 10 mm patient's side assistant trocar on the right. All of these sites are previously infiltrated with ropivacaine 0.5% also diluted with the same volume of saline confirming that the fascia is well infiltrated  By monitoring the peritoneal bubble through the laparoscope. A total of 60 cc of ropivacaine 0.5% diluted with 60 cc of saline is utilized to performed a paracervical block, the peritoneal wash in the trocar sites infiltration. The robot is docked on the left. A monopolar scissor is inserted in arm #1, a PK gyrus forcep  is inserted in arm #2 and a pro grasp forcep is inserted in arm #3. Preparation and docking is completed in 29 minutes.  We start on the left side by cauterizing the round ligament and sectioning it. We then grasped the left  tube and proceed with cauterization and section of the mesosalpinx until the tubal remnant is completely removed through the 10 mm patient side assistant trocar. We can now cauterize the utero-ovarian ligament and section it. We now have entry in the retroperitoneal space and are able to dissect the posterior broad ligament until we reached the KOH ring keeping the left ureter under direct visualization. We now entered the anterior sheet of the broad ligament until it crosses over the anterior KOH ring. We skeletonize the vascular bundle and proceed with the right side. Round ligament is cauterized and sectioned. The right tubal remnant is grasped in its mesosalpinx was cauterized and sectioned. The tube is removed from the abdominal cavity. We then cauterized the utero-ovarian ligament and entered the retroperitoneal space through the broad ligament opening. We are again able to dissect the posterior sheath of the broad ligament keeping the right ureter under direct visualization all the way to the KOH ring. The anterior sheath of the broad ligament is also opened until it meets left counterpart. The vascular bundle is skeletonize. We then filled the bladder with 200 cc of saline to clearly identify its junction with the uterus. It is now easy to proceed with complete dissection of the bladder away from the uterus until the anterior KOH ring is completely visualized. The bladder is then emptied again and we proceed with cauterization of the right uterine vessels at the level of the KOH ring again having the right ureter under direct  visualization. the left vascular bundle was then cauterized and the same fashion.    The vaginal occluder is inflated. We proceed with the 360 colpotomy starting on the anterior vagina using an open monopolar scissors until the uterus is completely freed.  The anterior fibroid is then sectioned away from the uterus allowing [place] to deliver the body of the uterus with multiple  fibroids through the vagina. That fibroid is then bisected into using monopolar scissors and both pieces are delivered through the vagina. We reestablished a good pneumoperitoneum and proceed with closure of the vaginal cuff.Instruments are then modified for a  suture cut in arm #1, a long tip forcep and arm #2 and PK gyrus forcep and arm #3. The vaginal cuff is closed with figure-of-eight stitches of 0 Vicryl and shows a satisfactory hemostasis. We then irrigated profusely with warm saline and again confirm satisfactory hemostasis. Both ureters are easily visualize and demonstrate good peristaltic is him and no dilation.  A sheet of Interceed is then positioned on the vaginal cuff. Consult time including morcellation of the specimen is 3 hours and 15 minutes.  The robot is undocked and all instruments are removed. We then removed all trochars under direct visualization after evacuating the pneumoperitoneum. The supraumbilical fascia is closed with the previously placed pursestring suture of 0 Vicryl. The fascia of the 10 mm right side trocar is closed with a figure-of-eight stitch of 0 Vicryl. The skin is then closed with subcuticular suture of 3-0 Monocryl and Dermabond.  A speculum is inserted in the vagina to confirm satisfactory closure and hemostasis of the vaginal cuff.  Instrument and sponge count is complete x2. Estimated blood loss is 50 cc. This procedure is well tolerated by the patient is  taken in recovery room in a well and stable condition.  Specimen uterus and 2 tubes weighing 497 g sent to pathology

## 2012-07-10 NOTE — Anesthesia Postprocedure Evaluation (Signed)
  Anesthesia Post-op Note  Patient: Sheri Shepard  Procedure(s) Performed: Procedure(s): ROBOTIC ASSISTED TOTAL HYSTERECTOMY WITH BILATERAL SALPINGECTOMY (Bilateral)  Patient Location: PACU  Anesthesia Type:General  Level of Consciousness: awake, alert  and oriented  Airway and Oxygen Therapy: Patient Spontanous Breathing  Post-op Pain: none  Post-op Assessment: Post-op Vital signs reviewed, Patient's Cardiovascular Status Stable, Respiratory Function Stable, Patent Airway, No signs of Nausea or vomiting and Pain level controlled  Post-op Vital Signs: Reviewed and stable  Complications: No apparent anesthesia complications

## 2012-07-11 ENCOUNTER — Encounter (HOSPITAL_COMMUNITY): Payer: Self-pay | Admitting: Obstetrics and Gynecology

## 2012-07-11 MED ORDER — ONDANSETRON HCL 4 MG PO TABS: 4.0000 mg | ORAL_TABLET | Freq: Three times a day (TID) | ORAL | Status: DC | PRN

## 2012-07-11 MED ORDER — HYDROMORPHONE HCL 2 MG PO TABS: 2.0000 mg | ORAL_TABLET | ORAL | Status: DC | PRN

## 2012-07-11 MED ORDER — IBUPROFEN 600 MG PO TABS: 600.0000 mg | ORAL_TABLET | Freq: Four times a day (QID) | ORAL | Status: DC | PRN

## 2012-07-11 NOTE — Progress Notes (Signed)
Kourtlyn Charlet is a62 y.o.  [id number]  Post Op Date # 1; Robot Assisted Total Laparoscopic Hysterectomy with Bilateral Salpingectomy  Subjective: Patient is Doing well postoperatively. Patient has Pain managed with oral Dilaudid., Patient is ambulating, voiding and tolerating a regular diet.  Objective: Vital signs in last 24 hours: Temp:  [97.8 F (36.6 C)-99 F (37.2 C)] 98.2 F (36.8 C) (05/16 0519) Pulse Rate:  [90-114] 90 (05/16 0519) Resp:  [16-30] 18 (05/16 0519) BP: (108-148)/(51-85) 119/66 mmHg (05/16 0519) SpO2:  [96 %-100 %] 100 % (05/16 0519) Weight:  [162 lb (73.483 kg)] 162 lb (73.483 kg) (05/15 1503)  Intake/Output from previous day: 05/15 0701 - 05/16 0700 In: 2300 [I.V.:2300] Out: 2900 [Urine:2850] Intake/Output this shift:   No results found for this basename: WBC, HGB, HCT, PLT,  in the last 168 hours   Recent Labs Lab 07/10/12 0625  NA 135  K 3.4*  CL 100  CO2 26  BUN 7  CREATININE 0.67  CALCIUM 10.0  PROT 7.8  BILITOT 0.6  ALKPHOS 119*  ALT 15  AST 19  GLUCOSE 109*    EXAM: General: alert, cooperative and no distress Resp: clear to auscultation bilaterally Cardio: regular rate and rhythm, S1, S2 normal, no murmur, click, rub or gallop GI: Bowel sounds present and incisions are intact without evidence of infection. Extremities: SCD hose in place and functioning; no calf tenderness and negative Homan's Vaginal pad clean   Assessment: s/p Procedure(s): ROBOTIC ASSISTED TOTAL HYSTERECTOMY WITH BILATERAL SALPINGECTOMY: stable and progressing well  Plan: Discharge home  LOS: 1 day    Quadre Bristol, PA-C 07/11/2012 7:48 AM

## 2012-07-24 ENCOUNTER — Ambulatory Visit: Admit: 2012-07-24 | Payer: Self-pay | Admitting: Obstetrics and Gynecology

## 2012-07-24 SURGERY — ROBOTIC ASSISTED TOTAL HYSTERECTOMY
Anesthesia: General

## 2012-09-01 ENCOUNTER — Encounter: Payer: 59 | Admitting: Family Medicine

## 2012-09-10 ENCOUNTER — Encounter: Payer: Self-pay | Admitting: Family Medicine

## 2012-09-10 ENCOUNTER — Ambulatory Visit (INDEPENDENT_AMBULATORY_CARE_PROVIDER_SITE_OTHER): Payer: 59 | Admitting: Family Medicine

## 2012-09-10 VITALS — BP 150/81 | HR 92 | Temp 97.7°F | Ht 62.0 in | Wt 159.0 lb

## 2012-09-10 DIAGNOSIS — Z0000 Encounter for general adult medical examination without abnormal findings: Secondary | ICD-10-CM

## 2012-09-10 NOTE — Patient Instructions (Addendum)
To keep you as healthy as possible your weight should be around 140-145. Could aim to lose 1-2 lbs a week.  Eat less high calorie foods.  YOu are at risk for diabetes given your family history best way to prevent is by weight control and exercise  Use tylenol as needed for muscle joint aches  Check your blood pressure every so often at drug stores it should be < 140/90.  If it is above that then come in for a visit  Schedule at time to remove your mole

## 2012-09-10 NOTE — Progress Notes (Signed)
  Subjective:    Patient ID: Caprice Red, female    DOB: [birth year], 47 y.o.   MRN: [id number]  HPI  Here for CPE Feels well except for some mild occasional joint pains Had robotic hysterectomy in may that went well  Patient reports no  vision/ hearing changes,anorexia, weight change, fever ,adenopathy, persistant / recurrent hoarseness, swallowing issues, chest pain, edema,persistant / recurrent cough, hemoptysis, dyspnea(rest, exertional, paroxysmal nocturnal), gastrointestinal  bleeding (melena, rectal bleeding), abdominal pain, excessive heart burn, GU symptoms(dysuria, hematuria, pyuria, voiding/incontinence  Issues) syncope, focal weakness, severe memory loss, concerning skin lesions, depression, anxiety, abnormal bruising/bleeding, major joint swelling, breast masses or abnormal vaginal bleeding.     Review of Systems     Objective:   Physical Exam Alert no acute distress Neck:  No deformities, thyromegaly, masses, or tenderness noted.   Supple with full range of motion without pain. Ears:  External ear exam shows no significant lesions or deformities.  Otoscopic examination reveals clear canals, tympanic membranes are intact bilaterally slightly dull without bulging, retraction, inflammation or discharge. Hearing is grossly normal bilaterall Heart - Regular rate and rhythm.  No murmurs, gallops or rubs.    Lungs:  Normal respiratory effort, chest expands symmetrically. Lungs are clear to auscultation, no crackles or wheezes. Abdomen: soft and non-tender without masses, organomegaly or hernias noted.  No guarding or rebound Extremities:  No cyanosis, edema, or deformity noted with good range of motion of all major joints.   Skin:  Intact no rashes  Has dark stuck on uniform lesion on left anter chest       Assessment & Plan:   CPE - normal exam See AVS for suggestions To return for mole removal

## 2012-11-26 ENCOUNTER — Ambulatory Visit (INDEPENDENT_AMBULATORY_CARE_PROVIDER_SITE_OTHER): Payer: 59 | Admitting: Family Medicine

## 2012-11-26 ENCOUNTER — Encounter: Payer: Self-pay | Admitting: Family Medicine

## 2012-11-26 VITALS — BP 119/42 | HR 80 | Temp 98.6°F | Ht 62.0 in | Wt 157.0 lb

## 2012-11-26 DIAGNOSIS — G44209 Tension-type headache, unspecified, not intractable: Secondary | ICD-10-CM

## 2012-11-26 MED ORDER — KETOROLAC TROMETHAMINE 60 MG/2ML IM SOLN: 60.0000 mg | Freq: Once | INTRAMUSCULAR | Status: DC

## 2012-11-26 MED ORDER — KETOROLAC TROMETHAMINE 60 MG/2ML IM SOLN
60.0000 mg | Freq: Once | INTRAMUSCULAR | Status: AC
  Administered 2012-11-26: 60 mg via INTRAMUSCULAR

## 2012-11-26 NOTE — Assessment & Plan Note (Signed)
Pt with very classic signs of tension HA, no focal deficits.  Will try toradol 60 mg IM x 1, reassess and then have pt take Advil or aleve scheduled for the next couple days.  Red flags to f/u include focal deficit, worst HA of life, blurred vision, or intractable HA after a 2-3 days of compliant tx.  

## 2012-11-26 NOTE — Progress Notes (Signed)
Nela Bascom is a 47 y.o. female who presents today for HA that have been ongoing for the past week.  Pt states they began in her occipital region, has never had them before, described as tightness in the back of head that cause sharp intermittent pain.  Rated 7/10, no nausea, vomiting, photophobia, of focal neurologic deficits.  Denies any tinnitus, dysphagia, fevers, neck stiffness, trauma.  However, does note increased stress at work over the past couple of weeks, with constant monitoring of computer screens.   Current Outpatient Prescriptions on File Prior to Visit  Medication Sig Dispense Refill  . Calcium Carbonate-Vitamin D (CALTRATE 600+D PO) Take 2 tablets by mouth daily.      . Multiple Vitamins-Minerals (MULTIVITAMIN WITH MINERALS) tablet Take 1 tablet by mouth daily.       No current facility-administered medications on file prior to visit.    ROS: Per HPI.  All other systems reviewed and are negative.   Physical Exam Filed Vitals:   11/26/12 0849  BP: 119/42  Pulse: 80  Temp: 98.6 F (37 C)    Physical Examination: General appearance - alert, well appearing, and in no distress Heart - normal rate and regular rhythm, no murmurs noted Neurological - neck supple without rigidity, cranial nerves II through XII intact, DTR's normal and symmetric  Lab Results  Component Value Date   CREATININE 0.67 07/10/2012

## 2012-11-26 NOTE — Patient Instructions (Signed)

## 2013-03-18 ENCOUNTER — Ambulatory Visit: Payer: 59 | Admitting: Family Medicine

## 2013-07-22 ENCOUNTER — Ambulatory Visit (INDEPENDENT_AMBULATORY_CARE_PROVIDER_SITE_OTHER): Payer: 59 | Admitting: Family Medicine

## 2013-07-22 ENCOUNTER — Encounter: Payer: Self-pay | Admitting: Family Medicine

## 2013-07-22 VITALS — BP 133/86 | HR 80 | Temp 98.2°F | Ht 63.0 in | Wt 156.4 lb

## 2013-07-22 DIAGNOSIS — N390 Urinary tract infection, site not specified: Secondary | ICD-10-CM | POA: Insufficient documentation

## 2013-07-22 DIAGNOSIS — R300 Dysuria: Secondary | ICD-10-CM

## 2013-07-22 LAB — POCT UA - MICROSCOPIC ONLY

## 2013-07-22 LAB — POCT URINALYSIS DIPSTICK
BILIRUBIN UA: NEGATIVE
Glucose, UA: NEGATIVE
Ketones, UA: NEGATIVE
NITRITE UA: NEGATIVE
PH UA: 6
PROTEIN UA: NEGATIVE
Spec Grav, UA: 1.02
UROBILINOGEN UA: 0.2

## 2013-07-22 MED ORDER — CIPROFLOXACIN HCL 500 MG PO TABS: 500.0000 mg | ORAL_TABLET | Freq: Two times a day (BID) | ORAL | Status: DC

## 2013-07-22 NOTE — Assessment & Plan Note (Signed)
A: Symptomatic UTI, verified on UA.  P: - Send urine for culture - Cipro (allergic to penicillin, will avoid Keflex) - OTC Azo prn  - F/u if fails to improve

## 2013-07-22 NOTE — Patient Instructions (Signed)
Take the antibiotic twice daily for 10 days. Drink plenty of water.  Come back if you do not get better.  Mattia Liford M. Quill Grinder, M.D.

## 2013-07-22 NOTE — Progress Notes (Signed)
Patient ID: Sheri Shepard, female   DOB: 31-Aug-1965, 48 y.o.   MRN: [id number]    Subjective: HPI: Patient is a 48 y.o. female presenting to clinic today for same day appointment for UTI.  Urinary Tract Infection Patient complains of frequency, hesitancy and low back pain. She has had symptoms for 1 week. Patient also complains of back pain. Patient denies fever, stomach ache and vaginal discharge. Patient does not have a history of recurrent UTI. Patient does not have a history of pyelonephritis. States normally her urine turns bloody with UTI, but has not yet. (s/p hysterectomy, no chance of pregnancy)  History Reviewed: Nonsmoker.  ROS: Please see HPI above.  Objective: Office vital signs reviewed. BP 133/86  Pulse 80  Temp(Src) 98.2 F (36.8 C) (Oral)  Ht 5\' 3"  (1.6 m)  Wt 156 lb 6.4 oz (70.943 kg)  BMI 27.71 kg/m2  LMP 12/14/2011  Physical Examination:  General: Awake, alert. NAD HEENT: Atraumatic, normocephalic. MMM Pulm: CTAB, no wheezes Cardio: RRR, no murmurs appreciated Abdomen:+BS, soft, nontender, nondistended. No CVA tenderness  Assessment: 48 y.o. female with UTI  Plan: See Problem List and After Visit Summary

## 2013-07-25 LAB — URINE CULTURE

## 2013-08-06 ENCOUNTER — Telehealth: Payer: Self-pay | Admitting: Family Medicine

## 2013-08-06 MED ORDER — FLUCONAZOLE 150 MG PO TABS: 150.0000 mg | ORAL_TABLET | Freq: Once | ORAL | Status: AC

## 2013-08-06 NOTE — Telephone Encounter (Signed)
Pt informed. Ereka Brau Dawn  

## 2013-08-06 NOTE — Telephone Encounter (Signed)
Pt called because the antibiotics that she is taking has given her a yeast infection and would like something called in. jw

## 2013-08-06 NOTE — Telephone Encounter (Signed)
Sent in Diflucan Please let her know Tahks

## 2013-12-28 ENCOUNTER — Encounter: Payer: Self-pay | Admitting: Family Medicine

## 2014-02-23 ENCOUNTER — Telehealth: Payer: Self-pay | Admitting: *Deleted

## 2014-02-23 NOTE — Telephone Encounter (Signed)
Pt called needing a same day appt for today but there is nothing available.  She is having left arm weakness, tingling and numbness.  This has been going on for about 1 hour.  She already has an appt scheduled for the 2nd week of January but can't wait that long.  Advised to seek care at the ED or urgent care.  She voiced understanding and plans to go to UC. Will forward to MD to make him aware. Jazmin Hartsell,CMA

## 2014-03-10 ENCOUNTER — Ambulatory Visit (INDEPENDENT_AMBULATORY_CARE_PROVIDER_SITE_OTHER): Payer: 59 | Admitting: Family Medicine

## 2014-03-10 ENCOUNTER — Encounter: Payer: Self-pay | Admitting: Family Medicine

## 2014-03-10 VITALS — BP 118/76 | HR 88 | Temp 98.6°F | Ht 63.0 in | Wt 170.0 lb

## 2014-03-10 DIAGNOSIS — D229 Melanocytic nevi, unspecified: Secondary | ICD-10-CM

## 2014-03-10 DIAGNOSIS — R300 Dysuria: Secondary | ICD-10-CM

## 2014-03-10 DIAGNOSIS — J018 Other acute sinusitis: Secondary | ICD-10-CM

## 2014-03-10 DIAGNOSIS — J329 Chronic sinusitis, unspecified: Secondary | ICD-10-CM | POA: Insufficient documentation

## 2014-03-10 DIAGNOSIS — N342 Other urethritis: Secondary | ICD-10-CM

## 2014-03-10 LAB — POCT UA - MICROSCOPIC ONLY

## 2014-03-10 LAB — POCT URINALYSIS DIPSTICK
BILIRUBIN UA: NEGATIVE
GLUCOSE UA: NEGATIVE
Ketones, UA: NEGATIVE
NITRITE UA: NEGATIVE
Protein, UA: NEGATIVE
Spec Grav, UA: 1.02
Urobilinogen, UA: 0.2
pH, UA: 5.5

## 2014-03-10 MED ORDER — CEPHALEXIN 500 MG PO CAPS: 500.0000 mg | ORAL_CAPSULE | Freq: Three times a day (TID) | ORAL | Status: DC

## 2014-03-10 NOTE — Assessment & Plan Note (Signed)
Given 14 day course and double sickening will treat with antibiotics

## 2014-03-10 NOTE — Assessment & Plan Note (Signed)
Still appears benign.  Will remove if changing

## 2014-03-10 NOTE — Assessment & Plan Note (Addendum)
Seems to have again.  No upper trck signs.  Will treat with keflex given her URI probable sinusitis symptoms.  Discussed her a pcn allergy - Cant remember when it was or exactly what happened.  Did not need medical attention then.   Given low cross reactivity will use cephalexin

## 2014-03-10 NOTE — Patient Instructions (Addendum)
Good to see you today!  Thanks for coming in.  I think your tiredness is due to lack of sleep.  Try to go to bed at the same time every day and try to get at least 6 hours of sleep If you are feeling worse or having any fevers or weight loss or bleeding or nausea or vomiting come back  It sounds like you have a UTI infection - take keflex hree times daily for the next 7 days.  If you are not back to normal in 2 weeks call us If you have a rash or any breathing troubles stop this and call us If you develop fever or severe back pain call us

## 2014-03-10 NOTE — Progress Notes (Signed)
   Subjective:    Patient ID: Sheri Shepard, female    DOB: 1965/09/11, 49 y.o.   MRN: [id number]  HPI  URI  Has been sick for 12 days. Nasal discharge: yes and rescenlty bloody Medications tried: sudafed and ibuprofne Sick contacts: no  Symptoms Fever: no Headache or face pain: yes Tooth pain: no Sneezing: no Scratchy throat: had now better Allergies: no Muscle aches: had now better Severe fatigue Stiff neck: no Shortness of breath: no Rash: no Sore throat or swollen glands: no   ROS see HPI Smoking Status noted  Dysuria For last few days.  Noted change in color of urine and smell.  Similar to prior UTI.  No back pain or fever   Fatigue Works 2 jobs from 7-9 M-F and sometimes on Sunday.  Sleeps from 2-4 hours per night.  Feels tired and without much energy.  No fever or weight loss or adenopathy or night sweats or nausea or vomiting or bleeding.    Chief Complaint noted Review of Symptoms - see HPI PMH - Smoking status noted.   Vital Signs reviewed      Review of Systems     Objective:   Physical Exam  Alert no acute distress Heart - Regular rate and rhythm.  No murmurs, gallops or rubs.    Lungs:  Normal respiratory effort, chest expands symmetrically. Lungs are clear to auscultation, no crackles or wheezes. Abdomen: soft and non-tender without masses, organomegaly or hernias noted.  No guarding or rebound No CVAT Neck:  No deformities, thyromegaly, masses, or tenderness noted.   Supple with full range of motion without pain. Nose - clear to yellow discharge  Urine noted  Mole on Left chest - 1 cm stuck on appearance with clear borders - not changing - no bleeding She does not want off now      Assessment & Plan:

## 2014-07-08 ENCOUNTER — Other Ambulatory Visit: Payer: Self-pay | Admitting: Obstetrics and Gynecology

## 2014-07-12 LAB — CYTOLOGY - PAP

## 2014-07-28 ENCOUNTER — Encounter: Payer: Self-pay | Admitting: Family Medicine

## 2014-07-28 ENCOUNTER — Ambulatory Visit (INDEPENDENT_AMBULATORY_CARE_PROVIDER_SITE_OTHER): Payer: 59 | Admitting: Family Medicine

## 2014-07-28 VITALS — BP 118/58 | HR 86 | Temp 98.0°F | Ht 63.0 in | Wt 172.1 lb

## 2014-07-28 DIAGNOSIS — Z131 Encounter for screening for diabetes mellitus: Secondary | ICD-10-CM

## 2014-07-28 DIAGNOSIS — R635 Abnormal weight gain: Secondary | ICD-10-CM

## 2014-07-28 DIAGNOSIS — Z23 Encounter for immunization: Secondary | ICD-10-CM

## 2014-07-28 DIAGNOSIS — Z13220 Encounter for screening for lipoid disorders: Secondary | ICD-10-CM | POA: Diagnosis not present

## 2014-07-28 DIAGNOSIS — R739 Hyperglycemia, unspecified: Secondary | ICD-10-CM

## 2014-07-28 LAB — LIPID PANEL
CHOLESTEROL: 213 mg/dL — AB (ref 0–200)
HDL: 47 mg/dL (ref >=46)
LDL CALC: 148 mg/dL — AB (ref 0–99)
TRIGLYCERIDES: 92 mg/dL (ref <150)
Total CHOL/HDL Ratio: 4.5 Ratio
VLDL: 18 mg/dL (ref 0–40)

## 2014-07-28 NOTE — Patient Instructions (Signed)
Good to see you today!  Thanks for coming in.  I will call you if your tests are not good.  Otherwise I will send you a letter.  If you do not hear from me with in 2 weeks please call our office.     Your main goal is weight loss  Exercise - walking during and after work - start slowly and go up to one hour - avoid the golf cart   Diet - cut back on sweets, Substitute veggies and fruit for sweets  Goal - get to 150 lbs - aim is to lose 1-2 lbs a week - over several months  Congrats on the stopping smoking

## 2014-07-28 NOTE — Progress Notes (Signed)
   Subjective:    Patient ID: Sheri Shepard, female    DOB: [birth year], 49 y.o.   MRN: [id number]  HPI  For CPE Feels well Has been gaining weight.  Stopped smoking about a year ago.  Gained approximately 20 lbs   Patient reports no  vision/ hearing changes,anorexia, weight change, fever ,adenopathy, persistant / recurrent hoarseness, swallowing issues, chest pain, edema,persistant / recurrent cough, hemoptysis, dyspnea(rest, exertional, paroxysmal nocturnal), gastrointestinal  bleeding (melena, rectal bleeding), abdominal pain, excessive heart burn, GU symptoms(dysuria, hematuria, pyuria, voiding/incontinence  Issues) syncope, focal weakness, severe memory loss, concerning skin lesions, depression, anxiety, abnormal bruising/bleeding, major joint swelling, breast masses or abnormal vaginal bleeding.    Chief Complaint noted Review of Symptoms - see HPI PMH - Smoking status noted.   Vital Signs reviewed   Review of Systems     Objective:   Physical Exam  Alert no acute distress Ears:  External ear exam shows no significant lesions or deformities.  Otoscopic examination reveals clear canals, tympanic membranes are intact bilaterally without bulging, retraction, inflammation or discharge. Hearing is grossly normal bilaterall Neck:  No deformities, thyromegaly, masses, or tenderness noted.   Supple with full range of motion without pain. Heart - Regular rate and rhythm.  No murmurs, gallops or rubs.    Lungs:  Normal respiratory effort, chest expands symmetrically. Lungs are clear to auscultation, no crackles or wheezes.Abdomen: soft and non-tender without masses, organomegaly or hernias noted.  No guarding or rebound Extremities:  No cyanosis, edema, or deformity noted with good range of motion of all major joints.         Assessment & Plan:   Normal Exam

## 2014-07-28 NOTE — Assessment & Plan Note (Signed)
Likely due to stopping smoking, lack of exercise and diet.  We discussed approaches and came up with plans

## 2014-07-29 LAB — GLUCOSE, FASTING: Glucose, Fasting: 98 mg/dL (ref 70–99)

## 2014-07-30 ENCOUNTER — Encounter: Payer: Self-pay | Admitting: Family Medicine

## 2014-07-30 DIAGNOSIS — R739 Hyperglycemia, unspecified: Secondary | ICD-10-CM | POA: Insufficient documentation

## 2014-07-30 NOTE — Assessment & Plan Note (Addendum)
Entered in Error.  No hyperglycemia

## 2014-09-14 ENCOUNTER — Ambulatory Visit (INDEPENDENT_AMBULATORY_CARE_PROVIDER_SITE_OTHER): Payer: 59 | Admitting: Physician Assistant

## 2014-09-14 VITALS — BP 122/80 | HR 100 | Temp 98.8°F | Resp 18 | Ht 63.0 in | Wt 175.0 lb

## 2014-09-14 DIAGNOSIS — R319 Hematuria, unspecified: Secondary | ICD-10-CM | POA: Diagnosis not present

## 2014-09-14 DIAGNOSIS — M545 Low back pain, unspecified: Secondary | ICD-10-CM

## 2014-09-14 DIAGNOSIS — R300 Dysuria: Secondary | ICD-10-CM

## 2014-09-14 LAB — POCT UA - MICROSCOPIC ONLY
CASTS, UR, LPF, POC: NEGATIVE
CRYSTALS, UR, HPF, POC: NEGATIVE
Mucus, UA: NEGATIVE
Yeast, UA: NEGATIVE

## 2014-09-14 LAB — POCT URINALYSIS DIPSTICK
Bilirubin, UA: NEGATIVE
GLUCOSE UA: NEGATIVE
KETONES UA: NEGATIVE
Nitrite, UA: POSITIVE
PH UA: 6
Protein, UA: 100
SPEC GRAV UA: 1.02
UROBILINOGEN UA: 0.2

## 2014-09-14 MED ORDER — NITROFURANTOIN MONOHYD MACRO 100 MG PO CAPS: 100.0000 mg | ORAL_CAPSULE | Freq: Two times a day (BID) | ORAL | Status: DC

## 2014-09-14 NOTE — Patient Instructions (Signed)
Please take the macrobid twice daily for 5 days. For the low back pain, taking ibuprofen every 6 hours will help. Cycling between heat and ice will help. Please come back to see [place] if you're not improved in one week.   Urinary Tract Infection Urinary tract infections (UTIs) can develop anywhere along your urinary tract. Your urinary tract is your body's drainage system for removing wastes and extra water. Your urinary tract includes two kidneys, two ureters, a bladder, and a urethra. Your kidneys are a pair of bean-shaped organs. Each kidney is about the size of your fist. They are located below your ribs, one on each side of your spine. CAUSES Infections are caused by microbes, which are microscopic organisms, including fungi, viruses, and bacteria. These organisms are so small that they can only be seen through a microscope. Bacteria are the microbes that most commonly cause UTIs. SYMPTOMS  Symptoms of UTIs may vary by age and gender of the patient and by the location of the infection. Symptoms in young women typically include a frequent and intense urge to urinate and a painful, burning feeling in the bladder or urethra during urination. Older women and men are more likely to be tired, shaky, and weak and have muscle aches and abdominal pain. A fever may mean the infection is in your kidneys. Other symptoms of a kidney infection include pain in your back or sides below the ribs, nausea, and vomiting. DIAGNOSIS To diagnose a UTI, your caregiver will ask you about your symptoms. Your caregiver also will ask to provide a urine sample. The urine sample will be tested for bacteria and white blood cells. White blood cells are made by your body to help fight infection. TREATMENT  Typically, UTIs can be treated with medication. Because most UTIs are caused by a bacterial infection, they usually can be treated with the use of antibiotics. The choice of antibiotic and length of treatment depend on your  symptoms and the type of bacteria causing your infection. HOME CARE INSTRUCTIONS  If you were prescribed antibiotics, take them exactly as your caregiver instructs you. Finish the medication even if you feel better after you have only taken some of the medication.  Drink enough water and fluids to keep your urine clear or pale yellow.  Avoid caffeine, tea, and carbonated beverages. They tend to irritate your bladder.  Empty your bladder often. Avoid holding urine for long periods of time.  Empty your bladder before and after sexual intercourse.  After a bowel movement, women should cleanse from front to back. Use each tissue only once. SEEK MEDICAL CARE IF:   You have back pain.  You develop a fever.  Your symptoms do not begin to resolve within 3 days. SEEK IMMEDIATE MEDICAL CARE IF:   You have severe back pain or lower abdominal pain.  You develop chills.  You have nausea or vomiting.  You have continued burning or discomfort with urination. MAKE SURE YOU:   Understand these instructions.  Will watch your condition.  Will get help right away if you are not doing well or get worse. Document Released: 11/22/2004 Document Revised: 08/14/2011 Document Reviewed: 03/23/2011 [facility] Patient Information 2015 [place], [place]. This information is not intended to replace advice given to you by your health care provider. Make sure you discuss any questions you have with your health care provider.

## 2014-09-14 NOTE — Progress Notes (Signed)
   Subjective:    Patient ID: Sheri Shepard, female    DOB: 18-Apr-1965, 49 y.o.   MRN: [id number]  Chief Complaint  Patient presents with  . Back Pain    lower-pain since saturday   . burning with urination    today   . Hematuria    today    Medications, allergies, past medical history, surgical history, family history, social history and problem list reviewed and updated.  HPI  49 yof presents with uti sx.   Dysuria started yest. Hematuria one episode today. No fevers, chills. Taking azo without much relief. Sx similar to utis in past.   Also having right sided low back started 3 days ago after 8 hour drive. No radiation. No sciatica. No trauma to area. Pain is intermittent. No aggravating or alleviating factors. Rated 5/10.   Review of Systems See HPI    Objective:   Physical Exam  Constitutional:  BP 122/80 mmHg  Pulse 100  Temp(Src) 98.8 F (37.1 C) (Oral)  Resp 18  Ht 5\' 3"  (1.6 m)  Wt 175 lb (79.379 kg)  BMI 31.01 kg/m2  SpO2 98%  LMP 12/14/2011   Abdominal: There is no tenderness. There is no CVA tenderness.  Musculoskeletal:       Lumbar back: She exhibits normal range of motion, no tenderness, no bony tenderness and no spasm.  Negative SLR bilaterally.    Results for orders placed or performed in visit on 09/14/14  POCT UA - Microscopic Only  Result Value Ref Range   WBC, Ur, HPF, POC 10-15    RBC, urine, microscopic 20+    Bacteria, U Microscopic 2+    Mucus, UA Negative    Epithelial cells, urine per micros 0-3    Crystals, Ur, HPF, POC Negative    Casts, Ur, LPF, POC Negative    Yeast, UA Negative   POCT urinalysis dipstick  Result Value Ref Range   Color, UA yellow    Clarity, UA cloudy    Glucose, UA neg    Bilirubin, UA neg    Ketones, UA neg    Spec Grav, UA 1.020    Blood, UA large    pH, UA 6.0    Protein, UA 100    Urobilinogen, UA 0.2    Nitrite, UA pos    Leukocytes, UA small (1+) (A) Negative      Assessment & Plan:    Burning with urination - Plan: POCT UA - Microscopic Only, POCT urinalysis dipstick  Blood in urine - Plan: POCT UA - Microscopic Only, POCT urinalysis dipstick  Right-sided low back pain without sciatica --uti - macrobid bid 5 days --doubt pyelo with no cva ttp, no fevers, chills, normal vitals, no casts --likely ms strain after long car ride -- ibuprofen, ice/heat, light rom --rtc one week if no improvement sx  Julieta Gutting, PA-C Physician Assistant-Certified [facility]  09/14/2014 6:30 PM

## 2014-09-17 LAB — URINE CULTURE: Colony Count: >=100000

## 2014-09-17 MED ORDER — CIPROFLOXACIN HCL 250 MG PO TABS: 250.0000 mg | ORAL_TABLET | Freq: Two times a day (BID) | ORAL | Status: DC

## 2014-09-17 NOTE — Addendum Note (Signed)
Addended byJulieta Gutting on: 09/17/2014 02:10 PM   Modules accepted: Orders, Medications

## 2014-11-19 ENCOUNTER — Ambulatory Visit (INDEPENDENT_AMBULATORY_CARE_PROVIDER_SITE_OTHER): Payer: 59 | Admitting: Family Medicine

## 2014-11-19 VITALS — BP 145/80 | HR 90 | Temp 98.1°F | Wt 180.0 lb

## 2014-11-19 DIAGNOSIS — R21 Rash and other nonspecific skin eruption: Secondary | ICD-10-CM | POA: Diagnosis not present

## 2014-11-19 NOTE — Patient Instructions (Signed)
Thank you for coming to see me today. It was a pleasure. Today we talked about:   Rash: This may be a chemical burn. You can try over the counter Aloe lotion to help sooth and heal the skin. This will hopefully help with how sensitive your skin is. You already seem to be doing a great job healing this.  Please make an appointment to see [facility] for follow-up  If you have any questions or concerns, please do not hesitate to call the office at [phone].  Sincerely,  Cordelia Poche, MD

## 2014-11-19 NOTE — Progress Notes (Signed)
    Subjective   Sheri Shepard is a 49 y.o. female that presents for a same day visit  1. Rash: Symptoms started about 3 weeks ago. The rash located on her chin bilaterally under her mouth. Rash is itchy and burns. She has tried Aveno cream and OTC hydrocortisone cream. Overall rash looks improved but has gotten bigger. No new medications. No sick contacts. No fevers.  ROS Per HPI  Social History  Substance Use Topics  . Smoking status: Former Smoker    Types: Cigars    Quit date: 02/26/2010  . Smokeless tobacco: Never Used  . Alcohol Use: 1.0 oz/week    2 drink(s) per week     Comment: weekends    Allergies  Allergen Reactions  . Tramadol Hcl Shortness Of Breath and Itching  . Hydrocodone Itching  . Penicillins Hives and Itching  . Propoxyphene N-Acetaminophen Itching    Objective   BP 153/103 mmHg  Pulse 90  Temp(Src) 98.1 F (36.7 C) (Oral)  Wt 180 lb (81.647 kg)  LMP 12/14/2011  General: Well appearing. Skin: Two macular, slightly hyperpigmented lesions on chin. Non-tender. No erythema. No pustules/papules.   Assessment and Plan   No orders of the defined types were placed in this encounter.    Rash: probable chemical burn.  Continue current therapy  Can try aloe lotion to help healing  Follow-up if persists/worsens

## 2014-11-23 ENCOUNTER — Encounter: Payer: Self-pay | Admitting: Family Medicine

## 2014-11-23 ENCOUNTER — Ambulatory Visit (INDEPENDENT_AMBULATORY_CARE_PROVIDER_SITE_OTHER): Payer: 59 | Admitting: Family Medicine

## 2014-11-23 VITALS — BP 142/82 | HR 96 | Temp 98.3°F | Ht 63.0 in | Wt 180.2 lb

## 2014-11-23 DIAGNOSIS — R102 Pelvic and perineal pain: Secondary | ICD-10-CM

## 2014-11-23 DIAGNOSIS — G44229 Chronic tension-type headache, not intractable: Secondary | ICD-10-CM

## 2014-11-23 LAB — POCT UA - MICROSCOPIC ONLY

## 2014-11-23 LAB — POCT URINALYSIS DIPSTICK
Bilirubin, UA: NEGATIVE
Glucose, UA: NEGATIVE
KETONES UA: NEGATIVE
Leukocytes, UA: NEGATIVE
Nitrite, UA: NEGATIVE
PH UA: 6
PROTEIN UA: NEGATIVE
SPEC GRAV UA: 1.01
UROBILINOGEN UA: 0.2

## 2014-11-23 MED ORDER — CYCLOBENZAPRINE HCL 5 MG PO TABS: 5.0000 mg | ORAL_TABLET | Freq: Three times a day (TID) | ORAL | Status: DC | PRN

## 2014-11-23 MED ORDER — SULFAMETHOXAZOLE-TRIMETHOPRIM 800-160 MG PO TABS: 1.0000 | ORAL_TABLET | Freq: Two times a day (BID) | ORAL | Status: DC

## 2014-11-23 NOTE — Progress Notes (Signed)
   Subjective:   Sheri Shepard is a 49 y.o. female  Here for  Chief Complaint  Patient presents with  . Headache    thinks its related elevated BP    #Headaches: Reports located in the front forehead and neck. Has not taken medication   #Navel pain: located under belly button, does not hurt when she pushes. Reports she had a robotic hysterectomy ~2 yrs ago. Reports pain with urination, not stinging like a a UTI.   Review of Systems:   Per HPI. All other systems reviewed and are negative expect as in HPI   PMH, PSH, Medications, Allergies, SocialHx and FHx reviewed and updated in EMR- marked as reviewed 11/23/2014   Objective:  BP 142/82 mmHg  Pulse 96  Temp(Src) 98.3 F (36.8 C) (Oral)  Ht 5\' 3"  (1.6 m)  Wt 180 lb 4 oz (81.761 kg)  BMI 31.94 kg/m2  LMP 12/14/2011  Gen:  49 y.o. female in NAD. Speaking in full sentences. Good eye contact HEENT: NCAT, MMM CV: RR Resp: Normal WOB GI: Soft, NTND. No TTP over suprapubic region. No masses.  Ext: WWP, no edema MSK: Intact gait. Full ROM. Tight Trapezius muscles bilaterally. Spasm of SCM as well.  Neuro: Alert and oriented, speech normal. CN 2-12 grossly intact. 5/5 strength in bilateral UE and LE Pysch: Normal mood and affect.    Assessment:     Sheri Shepard is a 49 y.o. female here for headaches and subumbilical pain    Plan:   #Tension Headaches: likely muscle tension from predominantly computer based work. No neurological abnormalities -recommended heating pad to area -Recommended use of NSAID -Discussed in detail neck stretches -Recommended massage therapy  #Navel Pain: unlikely UTI but will get UA. Could be bladder prolapse given h/o hysterectomy.  Not present on my exam - UA - Follow up in 2 weeks if pain persists. Recommend pelvic at that time to r/o cystocele.   Caren Macadam, MD, [facility] OB Fellow, [facility] 11/23/2014  11:14 AM

## 2014-11-23 NOTE — Patient Instructions (Signed)
Do neck stretches and use heat at least 3 three times per day but you can do this more often.

## 2015-01-24 ENCOUNTER — Ambulatory Visit (INDEPENDENT_AMBULATORY_CARE_PROVIDER_SITE_OTHER): Payer: 59 | Admitting: Family Medicine

## 2015-01-24 VITALS — BP 133/77 | HR 96 | Temp 98.6°F | Resp 12 | Ht 68.75 in | Wt 180.2 lb

## 2015-01-24 DIAGNOSIS — I889 Nonspecific lymphadenitis, unspecified: Secondary | ICD-10-CM

## 2015-01-24 MED ORDER — AZITHROMYCIN 250 MG PO TABS: ORAL_TABLET | ORAL | Status: DC

## 2015-01-24 NOTE — Progress Notes (Signed)
@[email or web address] @  This chart was scribed for Sheri Haber, MD by Thea Alken, ED Scribe. This patient was seen in room 1 and the patient's care was started at 5:47 PM.  Patient ID: Sheri Shepard MRN: [id number], DOB: 1966/02/22, 49 y.o. Date of Encounter: 01/24/2015, 5:42 PM  Primary Physician: Lind Covert, MD  Chief Complaint:  Chief Complaint  Patient presents with  . Nasal drainage  . Lymphadenopathy  . Gastroesophageal Reflux  . Sore Throat    HPI: 49 y.o. year old female with history below presents with nasal drainage. She reports associated lymph adenopathy in left neck and sore throat.  Pt is allergic to Cipro, states medication made her itch.    Past Medical History  Diagnosis Date  . Yeast infection   . Fibroids 2012  . Sickle cell trait ([place])   . Bladder infection   . Seasonal allergies   . History of blood transfusion 1991    unsure of [facility] [place]: Prior to Admission medications   Medication Sig Start Date End Date Taking? Authorizing Provider  Calcium Carbonate-Vitamin D (CALTRATE 600+D PO) Take 2 tablets by mouth daily.    Historical Provider, MD  ciprofloxacin (CIPRO) 250 MG tablet Take 1 tablet (250 mg total) by mouth 2 (two) times daily. Patient not taking: Reported on 01/24/2015 09/17/14   Araceli Bouche, PA  cyclobenzaprine (FLEXERIL) 5 MG tablet Take 1 tablet (5 mg total) by mouth 3 (three) times daily as needed for muscle spasms. Patient not taking: Reported on 01/24/2015 11/23/14   Caren Macadam, MD  Multiple Vitamins-Minerals (MULTIVITAMIN WITH MINERALS) tablet Take 1 tablet by mouth daily.    Historical Provider, MD  sulfamethoxazole-trimethoprim (BACTRIM DS,SEPTRA DS) 800-160 MG tablet Take 1 tablet by mouth 2 (two) times daily. Patient not taking: Reported on 01/24/2015 11/23/14   Caren Macadam, MD    Allergies:  Allergies  Allergen Reactions  . Tramadol Hcl Shortness Of Breath and  Itching  . Ciprofloxacin Itching  . Hydrocodone Itching  . Penicillins Hives and Itching  . Propoxyphene N-Acetaminophen Itching    Social History   Social History  . Marital Status: Single    Spouse Name: N/A  . Number of Children: N/A  . Years of Education: N/A   Occupational History  . Not on file.   Social History Main Topics  . Smoking status: Former Smoker    Types: Cigars    Quit date: 02/26/2010  . Smokeless tobacco: Never Used  . Alcohol Use: 1.0 oz/week    2 drink(s) per week     Comment: weekends  . Drug Use: No  . Sexual Activity: Yes    Birth Control/ Protection: Surgical     Comment: BTL    Other Topics Concern  . Not on file   Social History Narrative     Review of Systems: Constitutional: negative for chills, fever, night sweats, weight changes, or fatigue  HEENT: negative for vision changes, hearing loss, congestion, rhinorrhea, ST, epistaxis, or sinus pressure Cardiovascular: negative for chest pain or palpitations Respiratory: negative for hemoptysis, wheezing, shortness of breath, or cough Abdominal: negative for abdominal pain, nausea, vomiting, diarrhea, or constipation Dermatological: negative for rash Neurologic: negative for headache, dizziness, or syncope All other systems reviewed and are otherwise negative with the exception to those above and in the HPI.   Physical Exam: Blood pressure 133/77, pulse 96, temperature 98.6 F (37 C), temperature source Oral,  resp. rate 12, height 5' 8.75" (1.746 m), weight 180 lb 3.2 oz (81.738 kg), last menstrual period 12/14/2011, SpO2 98 %., Body mass index is 26.81 kg/(m^2). General: Well developed, well nourished, in no acute distress. Head: Normocephalic, atraumatic, eyes without discharge, sclera non-icteric, nares are without discharge. Bilateral auditory canals clear, TM's are without perforation, pearly grey and translucent with reflective cone of light bilaterally. Oral cavity moist, posterior  pharynx with mild erythema.   Neck: Supple. No thyromegaly. Full ROM. Bilateral tender anterior cervical nodes Lungs: Clear bilaterally to auscultation without wheezes, rales, or rhonchi. Breathing is unlabored. Heart: RRR with S1 S2. No murmurs, rubs, or gallops appreciated. Abdomen: Soft, non-tender, non-distended with normoactive bowel sounds. No hepatomegaly. No rebound/guarding. No obvious abdominal masses. Msk:  Strength and tone normal for age. Extremities/Skin: Warm and dry. No clubbing or cyanosis. No edema. No rashes or suspicious lesions. Neuro: Alert and oriented X 3. Moves all extremities spontaneously. Gait is normal. CNII-XII grossly in tact. Psych:  Responds to questions appropriately with a normal affect.    ASSESSMENT AND PLAN:  49 y.o. year old female with  This chart was scribed in my presence and reviewed by me personally.    ICD-9-CM ICD-10-CM   1. Cervical adenitis 289.3 I88.9 azithromycin (ZITHROMAX) 250 MG tablet     Signed, Sheri Haber, MD   By signing my name below, I, Raven Small, attest that this documentation has been prepared under the direction and in the presence of Sheri Haber, MD.  Electronically Signed: Thea Alken, ED Scribe. 01/24/2015. 5:53 PM.  Signed, Sheri Haber, MD 01/24/2015 5:42 PM

## 2015-04-29 ENCOUNTER — Ambulatory Visit: Payer: 59 | Admitting: Internal Medicine

## 2015-05-09 ENCOUNTER — Ambulatory Visit (INDEPENDENT_AMBULATORY_CARE_PROVIDER_SITE_OTHER): Payer: 59 | Admitting: Family Medicine

## 2015-05-09 VITALS — BP 124/63 | HR 89 | Temp 98.6°F | Wt 176.8 lb

## 2015-05-09 DIAGNOSIS — L918 Other hypertrophic disorders of the skin: Secondary | ICD-10-CM | POA: Diagnosis not present

## 2015-05-09 MED ORDER — FLUCONAZOLE 150 MG PO TABS: 150.0000 mg | ORAL_TABLET | Freq: Once | ORAL | Status: DC

## 2015-05-09 NOTE — Progress Notes (Signed)
   Subjective:    Patient ID: Sheri Shepard, female    DOB: 02-01-1966, 50 y.o.   MRN: [id number]  HPI  CC: mole on face  # Face skin tag:  First noticed 3 weeks ago  Irritating because it is getting caught in clothes when taking shirts on/off  Not really growing in size  No drainage, no bleeding  Has a few other skin tags around body ROS: no fever, no chill, no numbness of face  Social Hx: former smoker  Review of Systems   See HPI for ROS.   Past medical history, surgical, family, and social history reviewed and updated in the EMR as appropriate. Objective:  BP 124/63 mmHg  Pulse 89  Temp(Src) 98.6 F (37 C) (Oral)  Wt 176 lb 12.8 oz (80.196 kg)  LMP 12/14/2011 Vitals and nursing note reviewed  General: no apparent distress  Skin: right skin tag approx 46mm in length, pedunculated, located on right cheek bone. Minimally tender on movement. No hyperpigmentation or surrounding erythema.  Procedure note: Discussed risks of removal including pain, bleeding, infection, possibility of scarring on face, and benefits. Patient signed informed consent, copy to be scanned in chart. Area was prepped and cleaned with alcohol swab x 3. Suture removal forceps used to apply tension to skin tag and suture scissors used to clip skin tag. There was very minimal bleeding, covered with bandaid. Patient tolerated procedure well.  Assessment & Plan:  No problem-specific assessment & plan notes found for this encounter.

## 2015-06-03 ENCOUNTER — Encounter (HOSPITAL_COMMUNITY): Payer: Self-pay | Admitting: Emergency Medicine

## 2015-06-03 ENCOUNTER — Emergency Department (HOSPITAL_COMMUNITY)
Admission: EM | Admit: 2015-06-03 | Discharge: 2015-06-03 | Disposition: A | Discharge status: Home / Self Care | Payer: 59 | Attending: Emergency Medicine | Admitting: Emergency Medicine

## 2015-06-03 ENCOUNTER — Emergency Department (HOSPITAL_COMMUNITY): Payer: 59

## 2015-06-03 DIAGNOSIS — Y92410 Unspecified street and highway as the place of occurrence of the external cause: Secondary | ICD-10-CM | POA: Insufficient documentation

## 2015-06-03 DIAGNOSIS — Y9389 Activity, other specified: Secondary | ICD-10-CM | POA: Diagnosis not present

## 2015-06-03 DIAGNOSIS — Z862 Personal history of diseases of the blood and blood-forming organs and certain disorders involving the immune mechanism: Secondary | ICD-10-CM | POA: Insufficient documentation

## 2015-06-03 DIAGNOSIS — S199XXA Unspecified injury of neck, initial encounter: Secondary | ICD-10-CM | POA: Diagnosis present

## 2015-06-03 DIAGNOSIS — Z86018 Personal history of other benign neoplasm: Secondary | ICD-10-CM | POA: Diagnosis not present

## 2015-06-03 DIAGNOSIS — R51 Headache: Secondary | ICD-10-CM

## 2015-06-03 DIAGNOSIS — R519 Headache, unspecified: Secondary | ICD-10-CM

## 2015-06-03 DIAGNOSIS — S0990XA Unspecified injury of head, initial encounter: Secondary | ICD-10-CM | POA: Insufficient documentation

## 2015-06-03 DIAGNOSIS — Y998 Other external cause status: Secondary | ICD-10-CM | POA: Insufficient documentation

## 2015-06-03 DIAGNOSIS — S3992XA Unspecified injury of lower back, initial encounter: Secondary | ICD-10-CM | POA: Diagnosis not present

## 2015-06-03 DIAGNOSIS — S29002A Unspecified injury of muscle and tendon of back wall of thorax, initial encounter: Secondary | ICD-10-CM | POA: Insufficient documentation

## 2015-06-03 DIAGNOSIS — Z8619 Personal history of other infectious and parasitic diseases: Secondary | ICD-10-CM | POA: Insufficient documentation

## 2015-06-03 DIAGNOSIS — Z87448 Personal history of other diseases of urinary system: Secondary | ICD-10-CM | POA: Diagnosis not present

## 2015-06-03 DIAGNOSIS — Z880 Allergy status to penicillin: Secondary | ICD-10-CM | POA: Insufficient documentation

## 2015-06-03 DIAGNOSIS — Z79899 Other long term (current) drug therapy: Secondary | ICD-10-CM | POA: Diagnosis not present

## 2015-06-03 DIAGNOSIS — M542 Cervicalgia: Secondary | ICD-10-CM

## 2015-06-03 MED ORDER — METHOCARBAMOL 500 MG PO TABS: 500.0000 mg | ORAL_TABLET | Freq: Two times a day (BID) | ORAL | Status: DC | PRN

## 2015-06-03 MED ORDER — NAPROXEN 250 MG PO TABS: 250.0000 mg | ORAL_TABLET | Freq: Two times a day (BID) | ORAL | Status: DC

## 2015-06-03 MED ORDER — ACETAMINOPHEN 325 MG PO TABS
650.0000 mg | ORAL_TABLET | Freq: Once | ORAL | Status: AC
  Administered 2015-06-03: 650 mg via ORAL
  Filled 2015-06-03: qty 2

## 2015-06-03 NOTE — ED Notes (Signed)
Pt ambulates independently and with steady gait at time of discharge. Discharge instructions and follow up information reviewed with patient. No other questions or concerns voiced at this time. RX x 2. 

## 2015-06-03 NOTE — ED Notes (Signed)
See PA assessment 
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
## 2015-06-06 ENCOUNTER — Encounter: Payer: Self-pay | Admitting: Student

## 2015-06-06 ENCOUNTER — Ambulatory Visit (INDEPENDENT_AMBULATORY_CARE_PROVIDER_SITE_OTHER): Payer: 59 | Admitting: Student

## 2015-06-06 DIAGNOSIS — M542 Cervicalgia: Secondary | ICD-10-CM | POA: Diagnosis not present
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
## 2015-06-22 ENCOUNTER — Ambulatory Visit (INDEPENDENT_AMBULATORY_CARE_PROVIDER_SITE_OTHER): Payer: Self-pay | Admitting: Family Medicine

## 2015-06-22 ENCOUNTER — Encounter: Payer: Self-pay | Admitting: Family Medicine

## 2015-06-22 VITALS — BP 139/66 | HR 98 | Temp 98.7°F | Ht 68.75 in | Wt 176.9 lb

## 2015-06-22 DIAGNOSIS — S161XXD Strain of muscle, fascia and tendon at neck level, subsequent encounter: Secondary | ICD-10-CM

## 2015-06-22 DIAGNOSIS — S161XXA Strain of muscle, fascia and tendon at neck level, initial encounter: Secondary | ICD-10-CM | POA: Insufficient documentation

## 2015-06-22 NOTE — Assessment & Plan Note (Signed)
Unchanged.   No signs of cord compression or CNS mass lesion or fracture.  Most consistent with strain that is slowly resolving.  We discussed Physical Therapy and she may consider if not slow improvement
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
## 2015-06-27 ENCOUNTER — Telehealth: Payer: Self-pay | Admitting: Family Medicine

## 2015-06-27 DIAGNOSIS — S161XXD Strain of muscle, fascia and tendon at neck level, subsequent encounter: Secondary | ICD-10-CM

## 2015-06-28 ENCOUNTER — Encounter: Payer: Self-pay | Admitting: Family Medicine

## 2015-06-28 ENCOUNTER — Ambulatory Visit (INDEPENDENT_AMBULATORY_CARE_PROVIDER_SITE_OTHER): Payer: 59 | Admitting: Family Medicine

## 2015-06-28 VITALS — BP 123/71 | HR 84 | Temp 97.7°F | Ht 68.0 in | Wt 173.3 lb

## 2015-06-28 DIAGNOSIS — N390 Urinary tract infection, site not specified: Secondary | ICD-10-CM | POA: Diagnosis not present

## 2015-06-28 DIAGNOSIS — N3001 Acute cystitis with hematuria: Secondary | ICD-10-CM | POA: Diagnosis not present

## 2015-06-28 DIAGNOSIS — R319 Hematuria, unspecified: Secondary | ICD-10-CM

## 2015-06-28 DIAGNOSIS — M545 Low back pain: Secondary | ICD-10-CM

## 2015-06-28 LAB — POCT URINALYSIS DIPSTICK
Bilirubin, UA: NEGATIVE
Glucose, UA: NEGATIVE
Ketones, UA: NEGATIVE
NITRITE UA: POSITIVE
PROTEIN UA: 30
SPEC GRAV UA: 1.025
UROBILINOGEN UA: 0.2
pH, UA: 6

## 2015-06-28 LAB — POCT UA - MICROSCOPIC ONLY

## 2015-06-28 MED ORDER — SULFAMETHOXAZOLE-TRIMETHOPRIM 800-160 MG PO TABS: 1.0000 | ORAL_TABLET | Freq: Two times a day (BID) | ORAL | Status: DC
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
## 2015-06-28 NOTE — Assessment & Plan Note (Addendum)
UA & micro today consistent with UTI. Prior culture results reviewed - last urine culture grew enterobacter that was resistant to nitrofurantoin, sensitive to bactrim. With history of allergy to both penicillin and cipro, so will rx bactrim. Follow up if symptoms not improved with this medicine. Will send urine for culture. Counseled patient to follow up in ~1 month to obtain repeat UA to ensure hematuria resolved.

## 2015-06-28 NOTE — Patient Instructions (Signed)
Sent in an antibiotic for you Will culture your urine and call you if we need to switch antibiotics  Follow up if not improving  Please come back in about a month to follow up and make sure your urine doesn't still have blood in it  Be well, Dr. Ardelia Mems   Urinary Tract Infection Urinary tract infections (UTIs) can develop anywhere along your urinary tract. Your urinary tract is your body's drainage system for removing wastes and extra water. Your urinary tract includes two kidneys, two ureters, a bladder, and a urethra. Your kidneys are a pair of bean-shaped organs. Each kidney is about the size of your fist. They are located below your ribs, one on each side of your spine. CAUSES Infections are caused by microbes, which are microscopic organisms, including fungi, viruses, and bacteria. These organisms are so small that they can only be seen through a microscope. Bacteria are the microbes that most commonly cause UTIs. SYMPTOMS  Symptoms of UTIs may vary by age and gender of the patient and by the location of the infection. Symptoms in young women typically include a frequent and intense urge to urinate and a painful, burning feeling in the bladder or urethra during urination. Older women and men are more likely to be tired, shaky, and weak and have muscle aches and abdominal pain. A fever may mean the infection is in your kidneys. Other symptoms of a kidney infection include pain in your back or sides below the ribs, nausea, and vomiting. DIAGNOSIS To diagnose a UTI, your caregiver will ask you about your symptoms. Your caregiver will also ask you to provide a urine sample. The urine sample will be tested for bacteria and white blood cells. White blood cells are made by your body to help fight infection. TREATMENT  Typically, UTIs can be treated with medication. Because most UTIs are caused by a bacterial infection, they usually can be treated with the use of antibiotics. The choice of  antibiotic and length of treatment depend on your symptoms and the type of bacteria causing your infection. HOME CARE INSTRUCTIONS  If you were prescribed antibiotics, take them exactly as your caregiver instructs you. Finish the medication even if you feel better after you have only taken some of the medication.  Drink enough water and fluids to keep your urine clear or pale yellow.  Avoid caffeine, tea, and carbonated beverages. They tend to irritate your bladder.  Empty your bladder often. Avoid holding urine for long periods of time.  Empty your bladder before and after sexual intercourse.  After a bowel movement, women should cleanse from front to back. Use each tissue only once. SEEK MEDICAL CARE IF:   You have back pain.  You develop a fever.  Your symptoms do not begin to resolve within 3 days. SEEK IMMEDIATE MEDICAL CARE IF:   You have severe back pain or lower abdominal pain.  You develop chills.  You have nausea or vomiting.  You have continued burning or discomfort with urination. MAKE SURE YOU:   Understand these instructions.  Will watch your condition.  Will get help right away if you are not doing well or get worse.   This information is not intended to replace advice given to you by your health care provider. Make sure you discuss any questions you have with your health care provider.   Document Released: 11/22/2004 Document Revised: 11/03/2014 Document Reviewed: 03/23/2011 Elsevier Interactive Patient Education [employer].

## 2015-06-30 LAB — URINE CULTURE

## 2015-07-04 NOTE — Telephone Encounter (Signed)
Will forward to MD.  Last seen on 06/27/15 and Pt referrral discussed if there was slow improvement. Bhavin Monjaraz,CMA

## 2015-07-04 NOTE — Telephone Encounter (Signed)
Left message for pt to give me a return call to discuss. Page, Sheri Shepard

## 2015-07-04 NOTE — Telephone Encounter (Signed)
Patient requesting referral for physical therapy.  Please contact for appt and details.

## 2015-07-04 NOTE — Telephone Encounter (Signed)
Thanks Referall placed
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
## 2015-07-04 NOTE — Telephone Encounter (Signed)
Spoke with patient and she confirmed that she is still experiencing neck pain.  Advised her that we would place referral for PT and that she should hear from them in the next week or 2 regarding an appt.  Also advised her to make an appt for June to recheck her urine. Jazmin Hartsell,CMA

## 2015-07-15 ENCOUNTER — Ambulatory Visit: Payer: 59 | Attending: Family Medicine | Admitting: Physical Therapy

## 2015-07-15 ENCOUNTER — Encounter: Payer: Self-pay | Admitting: Physical Therapy

## 2015-07-15 DIAGNOSIS — M542 Cervicalgia: Secondary | ICD-10-CM | POA: Diagnosis present

## 2015-07-15 DIAGNOSIS — R252 Cramp and spasm: Secondary | ICD-10-CM
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
## 2015-07-19 ENCOUNTER — Ambulatory Visit: Payer: 59 | Admitting: Physical Therapy

## 2015-07-19 ENCOUNTER — Encounter: Payer: Self-pay | Admitting: Physical Therapy

## 2015-07-19 DIAGNOSIS — R252 Cramp and spasm: Secondary | ICD-10-CM

## 2015-07-19 DIAGNOSIS — M542 Cervicalgia: Secondary | ICD-10-CM

## 2015-07-19 NOTE — Therapy (Signed)
[facility] [place] [address] [place], [place], [address] Phone: [phone]   Fax:  [phone]  Physical Therapy Treatment  Patient Details  Name: Sheri Shepard MRN: [id number] Date of Birth: 1965-06-30 Referring Provider: Deirdre Priest  Encounter Date: 07/19/2015      PT End of Session - 07/19/15 0837    Visit Number 2   Date for PT Re-Evaluation 09/14/15   PT Start Time 0758   PT Stop Time 0852   PT Time Calculation (min) 54 min   Activity Tolerance Patient tolerated treatment well   Behavior During Therapy [facility] for tasks assessed/performed      Past Medical History  Diagnosis Date  . Yeast infection   . Fibroids 2012  . Sickle cell trait ([facility])   . Bladder infection   . Seasonal allergies   . History of blood transfusion 1991    [facility]    Past Surgical History  Procedure Laterality Date  . Cesarean section  [phone]    x 3  . Eye muscle surgery    . Tonsillectomy and adenoidectomy    . Tubal ligation    . Wisdom tooth extraction    . Robotic assisted total hysterectomy Bilateral 07/10/2012    Procedure: ROBOTIC ASSISTED TOTAL HYSTERECTOMY WITH BILATERAL SALPINGECTOMY;  Surgeon: Esmeralda Arthur, MD;  Location: [facility];  Service: Gynecology;  Laterality: Bilateral;  . Abdominal hysterectomy      There were no vitals filed for this visit.      Subjective Assessment - 07/19/15 0759    Subjective "Everything is about the same"   Currently in Pain? Yes   Pain Location Neck   Pain Orientation Right   Pain Descriptors / Indicators Tightness  No pain just stiffness                         [facility] Adult PT Treatment/Exercise - 07/19/15 0001    Exercises   Exercises Neck   Neck Exercises: Machines for Strengthening   UBE (Upper Arm Bike) L 3 frd/3rev   Neck Exercises: Standing   Other Standing Exercises Shoulder Ext yellow Tband 2x15   Other Standing Exercises Shoulder ER yellow Tband 2x10   Neck Exercises: Seated   Shoulder Rolls Backwards;Forwards;10 reps   Shoulder Rolls Limitations 4lb   Shoulder Flexion 10 reps;Weights  x2   Shoulder Flexion Weights (lbs) 2   Shoulder ABduction 10 reps;Both;Weights  x2   Shoulder Abduction Weights (lbs) 2lb   Other Seated Exercise Rows&Lats #20 2x15   Neck Exercises: Supine   Other Supine Exercise 3 way retraction x10   Modalities   Modalities Electrical Stimulation;Moist Heat   Moist Heat Therapy   Number Minutes Moist Heat 15 Minutes   Moist Heat Location Cervical   Electrical Stimulation   Electrical Stimulation Location Cervical   Electrical Stimulation Action IFC   Electrical Stimulation Parameters sitting, To pt tolerance   Electrical Stimulation Goals Pain   Manual Therapy   Manual Therapy Soft tissue mobilization;Manual Traction;Passive ROM   Soft tissue mobilization Posterior cervical para spinales, Upper traps    Passive ROM Cervical spine all directions   Manual Traction 3 x10 sec                  PT Short Term Goals - 07/19/15 9106    PT SHORT TERM GOAL #1   Title independent with  initial HEP   Baseline "Im working on them"   Status Partially Met           PT [place] - 07/15/15 3818    PT LONG TERM GOAL #1   Title decrease pain 50%   Time 8   Period Weeks   Status New   PT LONG TERM GOAL #2   Title increase cervical ROM 25%   Time 8   Period Weeks   Status New   PT LONG TERM GOAL #3   Title report 50% easier to back up the car   Time 8   Period Weeks   Status New   PT LONG TERM GOAL #4   Title understand posture and body mechanics   Time 8   Period Weeks   Status New               Plan - 07/19/15 2993    Clinical Impression Statement Pt tolerated a progression to resisted exercises well. Does reports that she could feel her muscles tighten up a little but pushes through. Pt tight in upper traps, some pain  in posterior cervical para spinales with palpation.   Rehab Potential Good   PT Frequency 2x / week   PT Duration 8 weeks   PT Treatment/Interventions Moist Heat;Electrical Stimulation;ADLs/Self Care Home Management;Ultrasound;Therapeutic activities;Therapeutic exercise;Manual techniques;Patient/family education;Taping   PT Next Visit Plan contiue to slowly add exercises      Patient will benefit from skilled therapeutic intervention in order to improve the following deficits and impairments:  Decreased range of motion, Decreased strength, Increased muscle spasms, Impaired flexibility, Postural dysfunction, Improper body mechanics, Pain  Visit Diagnosis: Cervicalgia  Cramp and spasm     Problem List Patient Active Problem List   Diagnosis Date Noted  . UTI (urinary tract infection) 06/28/2015  . Neck strain 06/22/2015  . Weight gain 07/28/2014  . Nevus 02/13/2012  . Sickle cell trait ([place])     [name], PTA  07/19/2015, 8:42 AM  [place], [place], [address] Phone: [phone]   Fax:  [phone]  Name: Sheri Shepard MRN: [id number] Date of Birth: August 23, 1965

## 2015-07-21 ENCOUNTER — Ambulatory Visit: Payer: 59 | Admitting: Physical Therapy

## 2015-07-21 ENCOUNTER — Encounter: Payer: Self-pay | Admitting: Physical Therapy

## 2015-07-21 DIAGNOSIS — M542 Cervicalgia: Secondary | ICD-10-CM

## 2015-07-21 DIAGNOSIS — R252 Cramp and spasm: Secondary | ICD-10-CM

## 2015-07-21 NOTE — Therapy (Signed)
[place], [place], [address] Phone: [phone]   Fax:  [phone]  Physical Therapy Treatment  Patient Details  Name: Sheri Shepard MRN: [id number] Date of Birth: [birth year] Referring Provider: [name]  Encounter Date: 07/21/2015      PT End of Session - 07/21/15 0843    Visit Number 3   Date for PT Re-Evaluation 09/14/15   PT Start Time 0800   PT Stop Time 0844   PT Time Calculation (min) 44 min   Activity Tolerance Patient tolerated treatment well      Past Medical History  Diagnosis Date  . Yeast infection   . Fibroids 2012  . Sickle cell trait ([place])   . Bladder infection   . Seasonal allergies   . History of blood transfusion 1991    [facility]    Past Surgical History  Procedure Laterality Date  . Cesarean section  [phone]    x 3  . Eye muscle surgery    . Tonsillectomy and adenoidectomy    . Tubal ligation    . Wisdom tooth extraction    . Robotic assisted total hysterectomy Bilateral 07/10/2012    Procedure: ROBOTIC ASSISTED TOTAL HYSTERECTOMY WITH BILATERAL SALPINGECTOMY;  Surgeon: Alwyn Pea, MD;  Location: [facility];  Service: Gynecology;  Laterality: Bilateral;  . Abdominal hysterectomy      There were no vitals filed for this visit.      Subjective Assessment - 07/21/15 0803    Subjective "Yesterday was not a good day" "I was in a lot of pain yesterday" Pt reports that she has to do a lot of lifting at her 2nd job and believes thats why she has been so tight and in a of pain.    Currently in Pain? Yes   Pain Score 5    Pain Location Neck                         [facility] Adult PT Treatment/Exercise - 07/21/15 0001    Exercises   Exercises Neck   Neck Exercises: Machines for Strengthening   UBE (Upper Arm Bike) L 2 frd/3rev   Neck Exercises: Theraband   Rows 20 reps;Green   Neck  Exercises: Standing   Other Standing Exercises Shoulder Ext yellow Tband 2x15   Other Standing Exercises Shoulder ER yellow Tband 2x10   Neck Exercises: Seated   Shoulder Rolls Backwards;Forwards;15 reps   Shoulder Rolls Limitations 4lb   Shoulder Flexion 10 reps;Weights  x2   Shoulder Flexion Weights (lbs) 2   Shoulder ABduction 10 reps;Both;Weights  x2   Shoulder Abduction Weights (lbs) 2lb   Other Seated Exercise Rows&Lats #20 2x15   Modalities   Modalities Ultrasound   Ultrasound   Ultrasound Location R trap   Ultrasound Parameters Combo 1MHz 1.2w/cm2   Ultrasound Goals Pain                  PT Short Term Goals - 07/19/15 0841    PT SHORT TERM GOAL #1   Title independent with initial HEP   Baseline "Im working on them"   Status Partially Met           PT Long Term Goals - 07/15/15 0821    PT LONG TERM GOAL #1   Title decrease pain 50%   Time 8   Period Weeks  Status New   PT LONG TERM GOAL #2   Title increase cervical ROM 25%   Time 8   Period Weeks   Status New   PT LONG TERM GOAL #3   Title report 50% easier to back up the car   Time 8   Period Weeks   Status New   PT LONG TERM GOAL #4   Title understand posture and body mechanics   Time 8   Period Weeks   Status New               Plan - 07/21/15 0315    Clinical Impression Statement Despite c/o  increase tightness in upper trap area and neck pt able to complete all exercises well. Does report some tightness with standing shoulder abduction. Tried combo e-Stim pt  reports that it felt better afterwards.   Rehab Potential Good   PT Frequency 2x / week   PT Duration 8 weeks   PT Treatment/Interventions Moist Heat;Electrical Stimulation;ADLs/Self Care Home Management;Ultrasound;Therapeutic activities;Therapeutic exercise;Manual techniques;Patient/family education;Taping   PT Next Visit Plan assess tx, continue to slowly add exercises      Patient will benefit from skilled  therapeutic intervention in order to improve the following deficits and impairments:  Decreased range of motion, Decreased strength, Increased muscle spasms, Impaired flexibility, Postural dysfunction, Improper body mechanics, Pain  Visit Diagnosis: Cervicalgia  Cramp and spasm     Problem List Patient Active Problem List   Diagnosis Date Noted  . UTI (urinary tract infection) 06/28/2015  . Neck strain 06/22/2015  . Weight gain 07/28/2014  . Nevus 02/13/2012  . Sickle cell trait ([place])     [name], PTA  07/21/2015, 8:46 AM  [place], [place], [address] Phone: [phone]   Fax:  [phone]  Name: Sheri Shepard MRN: [id number] Date of Birth: [birth year]

## 2015-07-28 ENCOUNTER — Other Ambulatory Visit: Payer: Self-pay | Admitting: Obstetrics and Gynecology

## 2015-07-28 DIAGNOSIS — R928 Other abnormal and inconclusive findings on diagnostic imaging of breast: Secondary | ICD-10-CM

## 2015-08-02 ENCOUNTER — Ambulatory Visit
Admission: RE | Admit: 2015-08-02 | Discharge: 2015-08-02 | Disposition: A | Discharge status: Home / Self Care | Payer: 59 | Source: Ambulatory Visit | Attending: Obstetrics and Gynecology | Admitting: Obstetrics and Gynecology

## 2015-08-02 ENCOUNTER — Ambulatory Visit: Payer: 59 | Attending: Family Medicine | Admitting: Physical Therapy

## 2015-08-02 ENCOUNTER — Encounter: Payer: Self-pay | Admitting: Physical Therapy

## 2015-08-02 DIAGNOSIS — R252 Cramp and spasm: Secondary | ICD-10-CM | POA: Insufficient documentation

## 2015-08-02 DIAGNOSIS — M542 Cervicalgia: Secondary | ICD-10-CM | POA: Insufficient documentation

## 2015-08-02 DIAGNOSIS — R928 Other abnormal and inconclusive findings on diagnostic imaging of breast: Secondary | ICD-10-CM

## 2015-08-02 NOTE — Therapy (Signed)
[place], [place], [address] Phone: [phone]   Fax:  [phone]  Physical Therapy Treatment  Patient Details  Name: Sheri Shepard MRN: [id number] Date of Birth: 1965/08/09 Referring Provider: [name]  Encounter Date: 08/02/2015      PT End of Session - 08/02/15 0925    Visit Number 4   Date for PT Re-Evaluation 09/14/15   PT Start Time 2111   PT Stop Time 0941   PT Time Calculation (min) 54 min   Activity Tolerance Patient tolerated treatment well   Behavior During Therapy [facility] for tasks assessed/performed      Past Medical History  Diagnosis Date  . Yeast infection   . Fibroids 2012  . Sickle cell trait ([place])   . Bladder infection   . Seasonal allergies   . History of blood transfusion 1991    uns[facility]    Past Surgical History  Procedure Laterality Date  . Cesarean section  [phone]    x 3  . Eye muscle surgery    . Tonsillectomy and adenoidectomy    . Tubal ligation    . Wisdom tooth extraction    . Robotic assisted total hysterectomy Bilateral 07/10/2012    Procedure: ROBOTIC ASSISTED TOTAL HYSTERECTOMY WITH BILATERAL SALPINGECTOMY;  Surgeon: Alwyn Pea, MD;  Location: [facility];  Service: Gynecology;  Laterality: Bilateral;  . Abdominal hysterectomy      There were no vitals filed for this visit.                       [place] Adult PT Treatment/Exercise - 08/02/15 0001    Exercises   Exercises Neck   Neck Exercises: Machines for Strengthening   UBE (Upper Arm Bike) L 2 3 frd/3rev   Neck Exercises: Standing   Other Standing Exercises Standing OHP with blue ball 2x10; Horizontal shoulder abduction yellow banc 2x15; Shoulder ER yellow Tband 2x15   Neck Exercises: Seated   Other Seated Exercise Rows&Lats #25 2x15   Neck Exercises: Supine   Other Supine Exercise 3 way retraction x10   Modalities   Modalities Electrical Stimulation;Moist Heat   Moist Heat Therapy   Number Minutes Moist Heat 15 Minutes   Moist Heat Location Cervical   Electrical Stimulation   Electrical Stimulation Location Cervical   Electrical Stimulation Action IFC   Electrical Stimulation Parameters sitting, to pt tolerance   Electrical Stimulation Goals Pain   Manual Therapy   Manual Therapy Soft tissue mobilization;Manual Traction;Passive ROM   Soft tissue mobilization Posterior cervical para spinales, Upper traps    Passive ROM Cervical spine all directions   Manual Traction 3 x10 sec                  PT Short Term Goals - 07/19/15 0841    PT SHORT TERM GOAL #1   Title independent with initial HEP   Baseline "Im working on them"   Status Partially Met           PT Long Term Goals - 07/15/15 0821    PT LONG TERM GOAL #1   Title decrease pain 50%   Time 8   Period Weeks   Status New   PT LONG TERM GOAL #2   Title increase cervical ROM 25%   Time 8   Period Weeks   Status New   PT LONG TERM  GOAL #3   Title report 50% easier to back up the car   Time 8   Period Weeks   Status New   PT LONG TERM GOAL #4   Title understand posture and body mechanics   Time 8   Period Weeks   Status New               Plan - 08/02/15 2244    Clinical Impression Statement Pt does not report an increase in pain during today treatment, but does reports tightness in her upper shoulder and pain in posterior neck. Tightness in upper traps noted with MT. Pt reports that the tightness does increase with exercises.  Pt reports that she had numbness in her L arm after last  the next day after last treatment.   Rehab Potential Good   PT Frequency 2x / week   PT Duration 8 weeks   PT Next Visit Plan asscess tx, contiue to slowly add exercises      Patient will benefit from skilled therapeutic intervention in order to improve the following deficits and impairments:  Decreased range of motion,  Decreased strength, Increased muscle spasms, Impaired flexibility, Postural dysfunction, Improper body mechanics, Pain  Visit Diagnosis: Cramp and spasm  Cervicalgia     Problem List Patient Active Problem List   Diagnosis Date Noted  . UTI (urinary tract infection) 06/28/2015  . Neck strain 06/22/2015  . Weight gain 07/28/2014  . Nevus 02/13/2012  . Sickle cell trait ([place])     [name], PTA  08/02/2015, 9:30 AM  [place], [place], [address] Phone: [phone]   Fax:  [phone]  Name: Sheri Shepard MRN: [id number] Date of Birth: [birth year]

## 2015-08-04 ENCOUNTER — Telehealth: Payer: Self-pay | Admitting: Family Medicine

## 2015-08-04 ENCOUNTER — Encounter: Payer: Self-pay | Admitting: Physical Therapy

## 2015-08-04 ENCOUNTER — Ambulatory Visit: Payer: 59 | Admitting: Physical Therapy

## 2015-08-04 DIAGNOSIS — R252 Cramp and spasm: Secondary | ICD-10-CM

## 2015-08-04 DIAGNOSIS — M542 Cervicalgia: Secondary | ICD-10-CM

## 2015-08-04 NOTE — Telephone Encounter (Signed)
Pt dropped off her FMLA papers to be filled out. Please fax when done and hold copy for patient since she will be in next week for an appointment. jw

## 2015-08-04 NOTE — Therapy (Signed)
[place], [place], [address] Phone: [phone]   Fax:  [phone]  Physical Therapy Treatment  Patient Details  Name: Sheri Shepard MRN: [id number] Date of Birth: 26-Nov-1965 Referring Provider: [name]  Encounter Date: 08/04/2015      PT End of Session - 08/04/15 0837    Visit Number 5   Date for PT Re-Evaluation 09/14/15   PT Start Time 0800   PT Stop Time 4854   PT Time Calculation (min) 55 min      Past Medical History  Diagnosis Date  . Yeast infection   . Fibroids 2012  . Sickle cell trait ([place])   . Bladder infection   . Seasonal allergies   . History of blood transfusion 1991    [facility]    Past Surgical History  Procedure Laterality Date  . Cesarean section  [phone]    x 3  . Eye muscle surgery    . Tonsillectomy and adenoidectomy    . Tubal ligation    . Wisdom tooth extraction    . Robotic assisted total hysterectomy Bilateral 07/10/2012    Procedure: ROBOTIC ASSISTED TOTAL HYSTERECTOMY WITH BILATERAL SALPINGECTOMY;  Surgeon: Alwyn Pea, MD;  Location: [facility];  Service: Gynecology;  Laterality: Bilateral;  . Abdominal hysterectomy      There were no vitals filed for this visit.      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 Yes   Pain Score 6    Pain Location Neck            [facility] PT Assessment - 08/04/15 0001    AROM   Overall AROM Comments cerv flexion WFL, ext adn RT rotation decreased 50%, left rotation and SB decreased 25%                     [facility] Adult PT Treatment/Exercise - 08/04/15 0001    Neck Exercises: Machines for Strengthening   UBE (Upper Arm Bike) L 2 3 frd/3rev   Cybex Row 25# 2 sets 15   Other Machines for Strengthening lat pull 25# 2 sets 15   Neck Exercises: Standing   UE D1 Weights  (lbs) 3  2 sets 10   Other Standing Exercises ball vs wall 5 times CC and CW   Neck Exercises: Seated   Other Seated Exercise 3# row,ext and abd 2 sets 10   Moist Heat Therapy   Number Minutes Moist Heat 15 Minutes   Moist Heat Location Cervical   Electrical Stimulation   Electrical Stimulation Location Cervical   Electrical Stimulation Parameters sitting   Electrical Stimulation Goals Pain   Manual Therapy   Manual Therapy Soft tissue mobilization;Passive ROM;Muscle Energy Technique   Soft tissue mobilization cerv/trap   Muscle Energy Technique to increase rotation and ext                PT Education - 08/04/15 0837    Education provided Yes   Education Details desk ergonomics and set up   [employer]) Educated Patient   Methods Explanation;Demonstration   Comprehension Verbalized understanding;Returned demonstration          PT Short Term Goals - 08/04/15 0803    PT SHORT TERM GOAL #1   Title independent with initial HEP   Status Achieved  PT Long Term Goals - 08/04/15 0803    PT LONG TERM GOAL #1   Title decrease pain 50%   Status On-going   PT LONG TERM GOAL #2   Title increase cervical ROM 25%   Status Partially Met   PT LONG TERM GOAL #3   Title report 50% easier to back up the car   Status On-going   PT LONG TERM GOAL #4   Title understand posture and body mechanics   Status On-going               Plan - 08/04/15 0838    Clinical Impression Statement imporved ROM,verb desk set up and ordering of items to help improve. tightness in traps noted- increased ROM after MT   PT Next Visit Plan cerv ROM and postural strengthening      Patient will benefit from skilled therapeutic intervention in order to improve the following deficits and impairments:  Decreased range of motion, Decreased strength, Increased muscle spasms, Impaired flexibility, Postural dysfunction, Improper body mechanics, Pain  Visit Diagnosis: Cramp and  spasm  Cervicalgia     Problem List Patient Active Problem List   Diagnosis Date Noted  . UTI (urinary tract infection) 06/28/2015  . Neck strain 06/22/2015  . Weight gain 07/28/2014  . Nevus 02/13/2012  . Sickle cell trait ([place])     [name],[name] PTA 08/04/2015, 8:39 AM  [place], [place], [address] Phone: [phone]   Fax:  [phone]  Name: Sheri Shepard MRN: [id number] Date of Birth: 1965-11-14

## 2015-08-05 NOTE — Telephone Encounter (Signed)
I will need to see her to fill in the form accurately.  We will discuss a there appointment on Wed.  Please let her know  Thanks  [place]

## 2015-08-05 NOTE — Telephone Encounter (Signed)
Patient is aware of this and voiced understanding. Sheri Shepard,CMA

## 2015-08-05 NOTE — Telephone Encounter (Signed)
Form is in providers box for review. Page, cma.

## 2015-08-09 ENCOUNTER — Ambulatory Visit: Payer: 59 | Admitting: Physical Therapy

## 2015-08-09 ENCOUNTER — Encounter: Payer: Self-pay | Admitting: Physical Therapy

## 2015-08-09 DIAGNOSIS — R252 Cramp and spasm: Secondary | ICD-10-CM

## 2015-08-09 DIAGNOSIS — M542 Cervicalgia: Secondary | ICD-10-CM

## 2015-08-09 NOTE — Therapy (Signed)
[place], [place], [address] Phone: [phone]   Fax:  [phone]  Physical Therapy Treatment  Patient Details  Name: Sheri Shepard MRN: [id number] Date of Birth: 05-26-1965 Referring Provider: [name]  Encounter Date: 08/09/2015      PT End of Session - 08/09/15 0844    Visit Number 6   Date for PT Re-Evaluation 09/14/15   PT Start Time 0800   PT Stop Time 0855   PT Time Calculation (min) 55 min   Activity Tolerance Patient tolerated treatment well   Behavior During Therapy [facility] for tasks assessed/performed      Past Medical History  Diagnosis Date  . Yeast infection   . Fibroids 2012  . Sickle cell trait ([place])   . Bladder infection   . Seasonal allergies   . History of blood transfusion 1991    [facility]    Past Surgical History  Procedure Laterality Date  . Cesarean section  [phone]    x 3  . Eye muscle surgery    . Tonsillectomy and adenoidectomy    . Tubal ligation    . Wisdom tooth extraction    . Robotic assisted total hysterectomy Bilateral 07/10/2012    Procedure: ROBOTIC ASSISTED TOTAL HYSTERECTOMY WITH BILATERAL SALPINGECTOMY;  Surgeon: Alwyn Pea, MD;  Location: [facility];  Service: Gynecology;  Laterality: Bilateral;  . Abdominal hysterectomy      There were no vitals filed for this visit.      Subjective Assessment - 08/09/15 0800    Subjective "I was feeling good up until yesterday" Pt stated that she turned her head to the left while driving and experienced a sharp pain in her L shoulder and back.   Currently in Pain? Yes   Pain Score 5    Pain Location --  Nexk and shoulders                          [facility] Adult PT Treatment/Exercise - 08/09/15 0001    Neck Exercises: Machines for Strengthening   UBE (Upper Arm Bike) L 2 3 frd/3rev   Cybex Row 25# 2 sets 15   Other Machines for  Strengthening lat pull 25# 2 sets 15   Neck Exercises: Standing   Other Standing Exercises Shoulder Ext red Tband 2x15   Other Standing Exercises ball vs wall 5 times CC and CW   Neck Exercises: Seated   Other Seated Exercise 4# row,ext and abd x15   Modalities   Modalities Electrical Stimulation;Moist Heat   Moist Heat Therapy   Number Minutes Moist Heat 15 Minutes   Moist Heat Location Cervical   Electrical Stimulation   Electrical Stimulation Location Cervical   Electrical Stimulation Parameters sitting   Electrical Stimulation Goals Pain   Manual Therapy   Manual Therapy Soft tissue mobilization;Passive ROM;Muscle Energy Technique   Soft tissue mobilization cerv/trap   Manual Traction 3 x10 sec   Muscle Energy Technique to increase rotation and ext                  PT Short Term Goals - 08/04/15 0803    PT SHORT TERM GOAL #1   Title independent with initial HEP   Status Achieved           PT Long Term Goals - 08/04/15 0803    PT LONG TERM  GOAL #1   Title decrease pain 50%   Status On-going   PT LONG TERM GOAL #2   Title increase cervical ROM 25%   Status Partially Met   PT LONG TERM GOAL #3   Title report 50% easier to back up the car   Status On-going   PT LONG TERM GOAL #4   Title understand posture and body mechanics   Status On-going               Plan - 08/09/15 0845    Clinical Impression Statement Tightness in trap remains, continues to have good ROM with MT. Tolerated all exercises well, does have increase tightness with seated bent over activities such as rows and extensions.   Rehab Potential Good   PT Frequency 2x / week   PT Duration 8 weeks   PT Treatment/Interventions Moist Heat;Electrical Stimulation;ADLs/Self Care Home Management;Ultrasound;Therapeutic activities;Therapeutic exercise;Manual techniques;Patient/family education;Taping   PT Next Visit Plan cerv ROM and postural strengthening      Patient will benefit from  skilled therapeutic intervention in order to improve the following deficits and impairments:  Decreased range of motion, Decreased strength, Increased muscle spasms, Impaired flexibility, Postural dysfunction, Improper body mechanics, Pain  Visit Diagnosis: Cramp and spasm  Cervicalgia     Problem List Patient Active Problem List   Diagnosis Date Noted  . UTI (urinary tract infection) 06/28/2015  . Neck strain 06/22/2015  . Weight gain 07/28/2014  . Nevus 02/13/2012  . Sickle cell trait ([place])     [name], PTA  08/09/2015, 8:46 AM  [place], [place], [address] Phone: [phone]   Fax:  [phone]  Name: Sheri Shepard MRN: [id number] Date of Birth: [birth year]

## 2015-08-10 ENCOUNTER — Encounter: Payer: Self-pay | Admitting: Family Medicine

## 2015-08-10 ENCOUNTER — Ambulatory Visit: Payer: 59 | Admitting: Family Medicine

## 2015-08-10 ENCOUNTER — Ambulatory Visit (INDEPENDENT_AMBULATORY_CARE_PROVIDER_SITE_OTHER): Payer: 59 | Admitting: Family Medicine

## 2015-08-10 VITALS — BP 132/92 | HR 87 | Temp 98.1°F | Ht 63.0 in | Wt 175.0 lb

## 2015-08-10 DIAGNOSIS — S161XXD Strain of muscle, fascia and tendon at neck level, subsequent encounter: Secondary | ICD-10-CM | POA: Diagnosis not present

## 2015-08-10 MED ORDER — GABAPENTIN 100 MG PO CAPS
100.0000 mg | ORAL_CAPSULE | Freq: Every day | ORAL | Status: DC
Start: 2015-08-10 — End: 2015-12-07
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
## 2015-08-10 NOTE — Patient Instructions (Signed)
Good to see you today!  Thanks for coming in.  Try the gabapentin 100 mg up to 3 tabs at night.  Increase slowly  If you have any weakness or loss of feeling then call me  This will gradually go away over the next 2-3 months if worsening or not improving in 1 month then call me

## 2015-08-12 ENCOUNTER — Ambulatory Visit: Payer: 59 | Admitting: Physical Therapy

## 2015-08-12 ENCOUNTER — Encounter: Payer: Self-pay | Admitting: Physical Therapy

## 2015-08-12 DIAGNOSIS — M542 Cervicalgia: Secondary | ICD-10-CM

## 2015-08-12 DIAGNOSIS — R252 Cramp and spasm: Secondary | ICD-10-CM

## 2015-08-12 NOTE — Therapy (Signed)
[place], [place], [address] Phone: [phone]   Fax:  [phone]  Physical Therapy Treatment  Patient Details  Name: Demira Gwynne MRN: [id number] Date of Birth: Jul 16, 1965 Referring Provider: [name]  Encounter Date: 08/12/2015      PT End of Session - 08/12/15 0843    Visit Number 7   Date for PT Re-Evaluation 09/14/15   PT Start Time 0800   PT Stop Time 0844   PT Time Calculation (min) 44 min   Activity Tolerance Patient tolerated treatment well   Behavior During Therapy [facility] for tasks assessed/performed      Past Medical History  Diagnosis Date  . Yeast infection   . Fibroids 2012  . Sickle cell trait ([place])   . Bladder infection   . Seasonal allergies   . History of blood transfusion 1991    [facility]    Past Surgical History  Procedure Laterality Date  . Cesarean section  [phone]    x 3  . Eye muscle surgery    . Tonsillectomy and adenoidectomy    . Tubal ligation    . Wisdom tooth extraction    . Robotic assisted total hysterectomy Bilateral 07/10/2012    Procedure: ROBOTIC ASSISTED TOTAL HYSTERECTOMY WITH BILATERAL SALPINGECTOMY;  Surgeon: Alwyn Pea, MD;  Location: [facility];  Service: Gynecology;  Laterality: Bilateral;  . Abdominal hysterectomy      There were no vitals filed for this visit.      Subjective Assessment - 08/12/15 0802    Subjective "Its been a little better but still a lot of stiffness"   Currently in Pain? Yes   Pain Score 4             [facility] PT Assessment - 08/12/15 0001    AROM   Overall AROM Comments cerv flexion WFL, Ext & RT rotation decreased 25%, left rotation and SB decreased WFL                     [facility] Adult PT Treatment/Exercise - 08/12/15 0001    Neck Exercises: Machines for Strengthening   UBE (Upper Arm Bike) L 2 3 frd/3rev   Cybex Row 35# 2 sets 15   Other Machines for Strengthening lat pull 35# 2 sets 15   Neck Exercises: Standing   Other Standing Exercises OHP with blue ball 2x15   Other Standing Exercises ball vs wall 7 times CC and CW   Neck Exercises: Seated   Other Seated Exercise 4# row,ext and abd x15   Modalities   Modalities Ultrasound   Ultrasound   Ultrasound Location Upper R trap   Ultrasound Parameters 1MHz 1.2w/cm2   Ultrasound Goals Pain                  PT Short Term Goals - 08/04/15 0803    PT SHORT TERM GOAL #1   Title independent with initial HEP   Status Achieved           PT Long Term Goals - 08/04/15 0803    PT LONG TERM GOAL #1   Title decrease pain 50%   Status On-going   PT LONG TERM GOAL #2   Title increase cervical ROM 25%   Status Partially Met   PT LONG TERM GOAL #3   Title report 50% easier to back up the car   Status  On-going   PT LONG TERM GOAL #4   Title understand posture and body mechanics   Status On-going               Plan - 08/12/15 0844    Clinical Impression Statement Pt has tightness in both upper traps that hurt with palpation, minor increase in cervical ROM. Continues to work hard and give good effort with exercises.     Rehab Potential Good   PT Frequency 2x / week   PT Duration 8 weeks   PT Treatment/Interventions Moist Heat;Electrical Stimulation;ADLs/Self Care Home Management;Ultrasound;Therapeutic activities;Therapeutic exercise;Manual techniques;Patient/family education;Taping   PT Next Visit Plan assess [place]      Patient will benefit from skilled therapeutic intervention in order to improve the following deficits and impairments:  Decreased range of motion, Decreased strength, Increased muscle spasms, Impaired flexibility, Postural dysfunction, Improper body mechanics, Pain  Visit Diagnosis: Cramp and spasm  Cervicalgia     Problem List Patient Active Problem List   Diagnosis Date Noted  . Neck strain 06/22/2015  . Weight gain  07/28/2014  . Nevus 02/13/2012  . Sickle cell trait ([place])     [name], PTA  08/12/2015, 8:49 AM  [place], [place], [address] Phone: [phone]   Fax:  [phone]  Name: Jericka Kadar MRN: [id number] Date of Birth: [birth year]

## 2015-08-16 ENCOUNTER — Encounter: Payer: Self-pay | Admitting: Physical Therapy

## 2015-08-16 ENCOUNTER — Ambulatory Visit: Payer: 59 | Admitting: Physical Therapy

## 2015-08-16 DIAGNOSIS — M542 Cervicalgia: Secondary | ICD-10-CM

## 2015-08-16 DIAGNOSIS — R252 Cramp and spasm: Secondary | ICD-10-CM | POA: Diagnosis not present

## 2015-08-16 NOTE — Therapy (Signed)
[place], [place], [address] Phone: [phone]   Fax:  [phone]  Physical Therapy Treatment  Patient Details  Name: Sheri Shepard MRN: [id number] Date of Birth: 1965/11/20 Referring Provider: [name]  Encounter Date: 08/16/2015      PT End of Session - 08/16/15 0847    Visit Number 8   Date for PT Re-Evaluation 09/14/15   PT Start Time 0759   PT Stop Time 0845   PT Time Calculation (min) 46 min   Activity Tolerance Patient tolerated treatment well   Behavior During Therapy [facility] for tasks assessed/performed      Past Medical History  Diagnosis Date  . Yeast infection   . Fibroids 2012  . Sickle cell trait ([place])   . Bladder infection   . Seasonal allergies   . History of blood transfusion 1991    [facility]    Past Surgical History  Procedure Laterality Date  . Cesarean section  [phone]    x 3  . Eye muscle surgery    . Tonsillectomy and adenoidectomy    . Tubal ligation    . Wisdom tooth extraction    . Robotic assisted total hysterectomy Bilateral 07/10/2012    Procedure: ROBOTIC ASSISTED TOTAL HYSTERECTOMY WITH BILATERAL SALPINGECTOMY;  Surgeon: Alwyn Pea, MD;  Location: [facility];  Service: Gynecology;  Laterality: Bilateral;  . Abdominal hysterectomy      There were no vitals filed for this visit.      Subjective Assessment - 08/16/15 0803    Subjective "Ill give myself a 5 today, not too bad."   Currently in Pain? Yes   Pain Score 5                          [facility] Adult PT Treatment/Exercise - 08/16/15 0001    Neck Exercises: Machines for Strengthening   UBE (Upper Arm Bike) L 3 3 frd/3rev   Cybex Row 35# 2 sets 15   Other Machines for Strengthening lat pull 35# 2 sets 15   Neck Exercises: Standing   Other Standing Exercises OHP with blue ball 2x15; Standing straight arm pull downs 35lb 2x15    Other Standing Exercises ball vs wall 7 times CC and CW   Neck Exercises: Seated   Other Seated Exercise 4# row,ext and abd x15   Other Seated Exercise Seated front raises 3lb 2x15    Modalities   Modalities Ultrasound   Ultrasound   Ultrasound Location Upper L Trap   Ultrasound Parameters 1MHz 1.3w/cm2   Ultrasound Goals Pain                  PT Short Term Goals - 08/04/15 0803    PT SHORT TERM GOAL #1   Title independent with initial HEP   Status Achieved           PT Long Term Goals - 08/16/15 0820    PT LONG TERM GOAL #3   Title report 50% easier to back up the car   Status Achieved   PT LONG TERM GOAL #4   Title understand posture and body mechanics   Status Partially Met               Plan - 08/16/15 0848    Clinical Impression Statement Pt has progressed and completed some goals. Pt reports reversing her  car is ~70% easier but she continues to have trouble when looking for oncoming traffic. Pt is able to complete all exercises appropriately but reports increase shoulder tightness after multiple reps. Pt reports that [place] did help loosen her R shoulder some.   Rehab Potential Good   PT Frequency 2x / week   PT Duration 8 weeks   PT Treatment/Interventions Moist Heat;Electrical Stimulation;ADLs/Self Care Home Management;Ultrasound;Therapeutic activities;Therapeutic exercise;Manual techniques;Patient/family education;Taping   PT Next Visit Plan Continues [place] if pt continues to improve.      Patient will benefit from skilled therapeutic intervention in order to improve the following deficits and impairments:  Decreased range of motion, Decreased strength, Increased muscle spasms, Impaired flexibility, Postural dysfunction, Improper body mechanics, Pain  Visit Diagnosis: Cramp and spasm  Cervicalgia     Problem List Patient Active Problem List   Diagnosis Date Noted  . Neck strain 06/22/2015  . Weight gain 07/28/2014  . Nevus 02/13/2012  .  Sickle cell trait ([place])     [name] , PTA   08/16/2015, 8:53 AM  [place], [place], [address] Phone: [phone]   Fax:  [phone]  Name: Sheri Shepard MRN: [id number] Date of Birth: 1965-03-01

## 2015-08-18 ENCOUNTER — Encounter: Payer: Self-pay | Admitting: Physical Therapy

## 2015-08-18 ENCOUNTER — Ambulatory Visit: Payer: 59 | Admitting: Physical Therapy

## 2015-08-18 DIAGNOSIS — M542 Cervicalgia: Secondary | ICD-10-CM

## 2015-08-18 DIAGNOSIS — R252 Cramp and spasm: Secondary | ICD-10-CM | POA: Diagnosis not present

## 2015-08-18 NOTE — Therapy (Signed)
[place], [place], [address] Phone: [phone]   Fax:  [phone]  Physical Therapy Treatment  Patient Details  Name: Sheri Shepard MRN: [id number] Date of Birth: [birth year] Referring Provider: [name]  Encounter Date: 08/18/2015      PT End of Session - 08/18/15 0850    Visit Number 9   Date for PT Re-Evaluation 09/14/15   PT Start Time 0800   PT Stop Time 0846   PT Time Calculation (min) 46 min   Activity Tolerance Patient tolerated treatment well   Behavior During Therapy [facility] for tasks assessed/performed      Past Medical History  Diagnosis Date  . Yeast infection   . Fibroids 2012  . Sickle cell trait ([place])   . Bladder infection   . Seasonal allergies   . History of blood transfusion 1991    unsure [facility]    Past Surgical History  Procedure Laterality Date  . Cesarean section  [phone]    x 3  . Eye muscle surgery    . Tonsillectomy and adenoidectomy    . Tubal ligation    . Wisdom tooth extraction    . Robotic assisted total hysterectomy Bilateral 07/10/2012    Procedure: ROBOTIC ASSISTED TOTAL HYSTERECTOMY WITH BILATERAL SALPINGECTOMY;  Surgeon: Alwyn Pea, MD;  Location: [facility];  Service: Gynecology;  Laterality: Bilateral;  . Abdominal hysterectomy      There were no vitals filed for this visit.      Subjective Assessment - 08/18/15 0803    Subjective "Its doing ok, about a 4, Its a little tight but that is to be expected."   Currently in Pain? Yes   Pain Score 4    Pain Location Neck            [facility] PT Assessment - 08/18/15 0001    Strength   Overall Strength Comments 5/5                     [facility] Adult PT Treatment/Exercise - 08/18/15 0001    Neck Exercises: Machines for Strengthening   UBE (Upper Arm Bike) L 3 3 frd/3rev   Cybex Row 35# 2 sets 15   Other Machines for Strengthening  lat pull 35# 2 sets 15   Neck Exercises: Standing   Other Standing Exercises OHP with blue ball 2x15; Standing straight arm pull downs 25lb 2x15;;Horizontal shoulder abduction green Tband 2x15   Other Standing Exercises ball vs wall 7 times CC and CW   Neck Exercises: Seated   Other Seated Exercise Seated face pulls 25lb 2x15   Other Seated Exercise Seated front raises 3lb 2x15    Ultrasound   Ultrasound Location Upper R trap   Ultrasound Parameters 1MHz 1.3w/cm2   Ultrasound Goals Pain                  PT Short Term Goals - 08/04/15 0803    PT SHORT TERM GOAL #1   Title independent with initial HEP   Status Achieved           PT Long Term Goals - 08/16/15 0820    PT LONG TERM GOAL #3   Title report 50% easier to back up the car   Status Achieved   PT LONG TERM GOAL #4   Title understand posture and body mechanics   Status Partially Met  Plan - 08/18/15 0850    Clinical Impression Statement Pt reports that she is improving overall, but does report shoulder tightness. Completed all interventions well, does reports some tightness after multiple reps with posterior scapular strengthening.   Rehab Potential Good   PT Frequency 2x / week   PT Duration 8 weeks   PT Treatment/Interventions Moist Heat;Electrical Stimulation;ADLs/Self Care Home Management;Ultrasound;Therapeutic activities;Therapeutic exercise;Manual techniques;Patient/family education;Taping   PT Next Visit Plan Continues [place] if pt continues to improve.      Patient will benefit from skilled therapeutic intervention in order to improve the following deficits and impairments:  Decreased range of motion, Decreased strength, Increased muscle spasms, Impaired flexibility, Postural dysfunction, Improper body mechanics, Pain  Visit Diagnosis: Cramp and spasm  Cervicalgia     Problem List Patient Active Problem List   Diagnosis Date Noted  . Neck strain 06/22/2015  . Weight gain  07/28/2014  . Nevus 02/13/2012  . Sickle cell trait ([place])     [name], PTA   08/18/2015, 8:52 AM  [place], [place], [address] Phone: [phone]   Fax:  [phone]  Name: Sheri Shepard MRN: [id number] Date of Birth: April 12, 1965

## 2015-08-25 ENCOUNTER — Ambulatory Visit: Payer: 59 | Admitting: Physical Therapy

## 2015-08-25 ENCOUNTER — Encounter: Payer: Self-pay | Admitting: Physical Therapy

## 2015-08-25 DIAGNOSIS — R252 Cramp and spasm: Secondary | ICD-10-CM

## 2015-08-25 DIAGNOSIS — M542 Cervicalgia: Secondary | ICD-10-CM

## 2015-08-25 NOTE — Therapy (Signed)
[place], [place], [address] Phone: [phone]   Fax:  [phone]  Physical Therapy Treatment  Patient Details  Name: Sheri Shepard MRN: [id number] Date of Birth: 09-28-1965 Referring Provider: [name]  Encounter Date: 08/25/2015      PT End of Session - 08/25/15 0840    Visit Number 10   Date for PT Re-Evaluation 09/14/15   PT Start Time 0800   PT Stop Time 0854   PT Time Calculation (min) 54 min   Activity Tolerance Patient tolerated treatment well;Patient limited by pain   Behavior During Therapy [facility] for tasks assessed/performed      Past Medical History  Diagnosis Date  . Yeast infection   . Fibroids 2012  . Sickle cell trait ([place])   . Bladder infection   . Seasonal allergies   . History of blood transfusion 1991    unsure [facility]    Past Surgical History  Procedure Laterality Date  . Cesarean section  [phone]    x 3  . Eye muscle surgery    . Tonsillectomy and adenoidectomy    . Tubal ligation    . Wisdom tooth extraction    . Robotic assisted total hysterectomy Bilateral 07/10/2012    Procedure: ROBOTIC ASSISTED TOTAL HYSTERECTOMY WITH BILATERAL SALPINGECTOMY;  Surgeon: Alwyn Pea, MD;  Location: [facility];  Service: Gynecology;  Laterality: Bilateral;  . Abdominal hysterectomy      There were no vitals filed for this visit.      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 Since then she has been taking ibuprofen and it has helped some.   Currently in Pain? Yes   Pain Score 4    Pain Location Shoulder   Pain Orientation Left   Pain Descriptors / Indicators Discomfort            [facility] PT Assessment - 08/25/15 0001    AROM   Overall AROM Comments cerv flexion WFL, Ext & RT  rotation decreased 25%, left rotation and SB WFL                     [facility] Adult PT Treatment/Exercise - 08/25/15 0001    Neck Exercises: Machines for Strengthening   UBE (Upper Arm Bike) L 3 3 frd/3rev   Cybex Row 35# 2 sets 15   Other Machines for Strengthening lat pull 35# 2 sets 15   Neck Exercises: Theraband   Horizontal ABduction 15 reps  x2   Horizontal ABduction Limitations green Tband    Neck Exercises: Standing   Other Standing Exercises Standing straight arm pull downs #35 2x15    Other Standing Exercises ball vs wall 7 times CC and CW   Neck Exercises: Seated   Other Seated Exercise Seated bent over rev flys 3lb 2x15   Other Seated Exercise Seated front raises 3lb 2x15    Modalities   Modalities Electrical Stimulation;Moist Heat   Moist Heat Therapy   Number Minutes Moist Heat 15 Minutes   Moist Heat Location Shoulder   Electrical Stimulation   Electrical Stimulation Location L shoulder   Electrical Stimulation Action IFC   Electrical Stimulation Parameters pt tolerance   Electrical Stimulation Goals Pain  PT Short Term Goals - 08/04/15 0803    PT SHORT TERM GOAL #1   Title independent with initial HEP   Status Achieved           PT Long Term Goals - 08/16/15 0820    PT LONG TERM GOAL #3   Title report 50% easier to back up the car   Status Achieved   PT LONG TERM GOAL #4   Title understand posture and body mechanics   Status Partially Met               Plan - 08/25/15 0840    Clinical Impression Statement Pt reports that she experienced a sharp pain in her L shoulder Tuesday. Pt able to complete most of today's exercises. Pt had to stop with seated bent over reverse fly's due to L shoulder pain.     Rehab Potential Good   PT Frequency 2x / week   PT Duration 8 weeks   PT Treatment/Interventions Moist Heat;Electrical Stimulation;ADLs/Self Care Home Management;Ultrasound;Therapeutic activities;Therapeutic  exercise;Manual techniques;Patient/family education;Taping   PT Next Visit Plan asscess tx      Patient will benefit from skilled therapeutic intervention in order to improve the following deficits and impairments:  Decreased range of motion, Decreased strength, Increased muscle spasms, Impaired flexibility, Postural dysfunction, Improper body mechanics, Pain  Visit Diagnosis: Cramp and spasm  Cervicalgia     Problem List Patient Active Problem List   Diagnosis Date Noted  . Neck strain 06/22/2015  . Weight gain 07/28/2014  . Nevus 02/13/2012  . Sickle cell trait ([place])     [name], PTA  08/25/2015, 8:44 AM  [place], [place], [address] Phone: [phone]   Fax:  [phone]  Name: Areonna Bran MRN: [id number] Date of Birth: [birth year]

## 2015-09-01 ENCOUNTER — Ambulatory Visit: Payer: 59 | Admitting: Physical Therapy

## 2015-09-01 ENCOUNTER — Encounter: Payer: Self-pay | Admitting: Physical Therapy

## 2015-09-01 ENCOUNTER — Ambulatory Visit: Payer: 59 | Attending: Family Medicine | Admitting: Physical Therapy

## 2015-09-01 DIAGNOSIS — R252 Cramp and spasm: Secondary | ICD-10-CM | POA: Insufficient documentation

## 2015-09-01 DIAGNOSIS — M542 Cervicalgia: Secondary | ICD-10-CM | POA: Insufficient documentation

## 2015-09-01 NOTE — Therapy (Signed)
[place], [place], [address] Phone: [phone]   Fax:  [phone]  Physical Therapy Treatment  Patient Details  Name: Sheri Shepard MRN: [id number] Date of Birth: [birth year] Referring Provider: [name]  Encounter Date: 09/01/2015      PT End of Session - 09/01/15 0842    Visit Number 11   Date for PT Re-Evaluation 09/14/15   PT Start Time 0800   PT Stop Time 0857   PT Time Calculation (min) 57 min   Activity Tolerance Patient tolerated treatment well   Behavior During Therapy [facility] for tasks assessed/performed      Past Medical History  Diagnosis Date  . Yeast infection   . Fibroids 2012  . Sickle cell trait ([place])   . Bladder infection   . Seasonal allergies   . History of blood transfusion 1991    [facility]    Past Surgical History  Procedure Laterality Date  . Cesarean section  [phone]    x 3  . Eye muscle surgery    . Tonsillectomy and adenoidectomy    . Tubal ligation    . Wisdom tooth extraction    . Robotic assisted total hysterectomy Bilateral 07/10/2012    Procedure: ROBOTIC ASSISTED TOTAL HYSTERECTOMY WITH BILATERAL SALPINGECTOMY;  Surgeon: Alwyn Pea, MD;  Location: [facility];  Service: Gynecology;  Laterality: Bilateral;  . Abdominal hysterectomy      There were no vitals filed for this visit.      Subjective Assessment - 09/01/15 0808    Subjective "Its all right about a 5 today"   Currently in Pain? Yes   Pain Score 5    Pain Location Neck   Pain Orientation Mid                         [facility] Adult PT Treatment/Exercise - 09/01/15 0001    Neck Exercises: Machines for Strengthening   UBE (Upper Arm Bike) L 3 3 frd/3rev   Cybex Row 35# 2 sets 15   Neck Exercises: Standing   Wall Push Ups 15 reps  x2   Other Standing Exercises OHP 8lb 2x10   Other Standing Exercises Overheads flex with  wall taps yellow ball 2x15   Neck Exercises: Seated   Shoulder Shrugs 10 reps  10lb 2x10   Other Seated Exercise Seated bent over rev flys 3lb 2x15   Other Seated Exercise Seated front raises 3lb 2x15    Neck Exercises: Supine   Other Supine Exercise 3 way retraction x15   Modalities   Modalities Electrical Stimulation;Moist Heat   Moist Heat Therapy   Number Minutes Moist Heat 15 Minutes   Moist Heat Location Shoulder   Electrical Stimulation   Electrical Stimulation Location cervi   Printmaker Action IFC   Electrical Stimulation Parameters pt tolerance    Electrical Stimulation Goals Pain   Manual Therapy   Manual Therapy Soft tissue mobilization;Passive ROM;Muscle Energy Technique   Soft tissue mobilization cerv/trap   Passive ROM Cervical spine all directions   Manual Traction 10 sec                  PT Short Term Goals - 08/04/15 0803    PT SHORT TERM GOAL #1   Title independent with initial HEP   Status Achieved  PT Long Term Goals - 08/16/15 0820    PT LONG TERM GOAL #3   Title report 50% easier to back up the car   Status Achieved   PT LONG TERM GOAL #4   Title understand posture and body mechanics   Status Partially Met               Plan - 09/01/15 0843    Clinical Impression Statement Pt able to perform all of today's interventions despite minor c/o shoulder tightness. Comites to report a little difficulty when driving   Rehab Potential Good   PT Frequency 2x / week   PT Duration 8 weeks   PT Treatment/Interventions Moist Heat;Electrical Stimulation;ADLs/Self Care Home Management;Ultrasound;Therapeutic activities;Therapeutic exercise;Manual techniques;Patient/family education;Taping   PT Next Visit Plan progress as tolerated       Patient will benefit from skilled therapeutic intervention in order to improve the following deficits and impairments:  Decreased range of motion, Decreased strength, Increased muscle  spasms, Impaired flexibility, Postural dysfunction, Improper body mechanics, Pain  Visit Diagnosis: Cramp and spasm  Cervicalgia     Problem List Patient Active Problem List   Diagnosis Date Noted  . Neck strain 06/22/2015  . Weight gain 07/28/2014  . Nevus 02/13/2012  . Sickle cell trait ([facility])     Ronald G Pemberton, PTA  09/01/2015, 8:45 AM  [facility] [address] , [place], [address] Phone: [phone]   Fax:  [phone]  Name: Taralynn Witherow MRN: [id number] Date of Birth: 07/18/1965     

## 2015-09-02 ENCOUNTER — Ambulatory Visit: Payer: 59 | Admitting: Physical Therapy

## 2015-09-07 ENCOUNTER — Encounter: Payer: Self-pay | Admitting: Family Medicine

## 2015-09-07 ENCOUNTER — Ambulatory Visit (INDEPENDENT_AMBULATORY_CARE_PROVIDER_SITE_OTHER): Payer: 59 | Admitting: Family Medicine

## 2015-09-07 VITALS — BP 133/75 | HR 91 | Temp 98.5°F | Ht 63.0 in | Wt 177.0 lb

## 2015-09-07 DIAGNOSIS — S161XXD Strain of muscle, fascia and tendon at neck level, subsequent encounter: Secondary | ICD-10-CM | POA: Diagnosis not present

## 2015-09-07 NOTE — Assessment & Plan Note (Signed)
No change.  No red flags for fracture or significant nerve impingement.  Urged to try gabapentin and continue exercise.  Red flags reviewed

## 2015-09-07 NOTE — Patient Instructions (Signed)
Good to see you today!  Thanks for coming in.  Try Loratadine (Clariten) 10 mg daily for allergies instead of benadryl  Exercise MWF - treadmill and bike for 15-30 minutes  Weight - lose 1-2 lb a week - weigh once week - cut back on portions of sweets and fats - eat more veggies  Try the gabapentin start with 1 tab at at night can move up to 3 tabs at night  If the neck is not slowly improving in the next 2 months come or if any worsening

## 2015-09-07 NOTE — Progress Notes (Signed)
Subjective  Patient is presenting with the following illnesses  Check up and   NECK PAIN This persists without much improvement.  Has not tried the gabapentin I perscribed last visit.  No weakness or incontinence.  Still doing Physical Therapy  Patient reports no  vision/ hearing changes,anorexia, weight change, fever ,adenopathy, persistant / recurrent hoarseness, swallowing issues, chest pain, edema,persistant / recurrent cough, hemoptysis, dyspnea(rest, exertional, paroxysmal nocturnal), gastrointestinal  bleeding (melena, rectal bleeding), abdominal pain, excessive heart burn, GU symptoms(dysuria, hematuria, pyuria, voiding/incontinence  Issues) syncope, focal weakness, severe memory loss, concerning skin lesions, depression, anxiety, abnormal bruising/bleeding, major joint swelling,   Chief Complaint noted Review of Symptoms - see HPI PMH - Smoking status noted.     Objective Vital Signs reviewed  Ears:  External ear exam shows no significant lesions or deformities.  Otoscopic examination reveals clear canals, tympanic membranes are intact bilaterally without bulging, retraction, inflammation or discharge. Hearing is grossly normal bilaterall Heart - Regular rate and rhythm.  No murmurs, gallops or rubs.    Lungs:  Normal respiratory effort, chest expands symmetrically. Lungs are clear to auscultation, no crackles or wheezes. Abdomen: soft and non-tender without masses, organomegaly or hernias noted.  No guarding or rebound Extremities:  No cyanosis, edema, or deformity noted with good range of motion of all major joints.   Neck - limited extension but good side to side movement. No focal pain or deformity Normal gail  able to get up and down from exam table without assistance   Assessments/Plans  See Encounter view if individual problem A/Ps not visible See after visit summary for details of patient instuctions  Health Maintenance - UTD

## 2015-09-08 ENCOUNTER — Ambulatory Visit: Payer: 59 | Admitting: Physical Therapy

## 2015-09-15 ENCOUNTER — Ambulatory Visit: Payer: 59 | Admitting: Physical Therapy

## 2015-09-15 ENCOUNTER — Encounter: Payer: Self-pay | Admitting: Physical Therapy

## 2015-09-15 DIAGNOSIS — M542 Cervicalgia: Secondary | ICD-10-CM

## 2015-09-15 DIAGNOSIS — R252 Cramp and spasm: Secondary | ICD-10-CM

## 2015-09-15 NOTE — Addendum Note (Signed)
Addended by: Sumner Boast on: 09/15/2015 12:06 PM   Modules accepted: Orders

## 2015-09-15 NOTE — Therapy (Signed)
[place], [place], [address] Phone: [phone]   Fax:  [phone]  Physical Therapy Treatment  Patient Details  Name: Sheri Shepard MRN: [id number] Date of Birth: 11-03-1965 Referring Provider: [name]  Encounter Date: 09/15/2015      PT End of Session - 09/15/15 0844    Visit Number 12   Date for PT Re-Evaluation 09/14/15   PT Start Time 0800   PT Stop Time 0845   PT Time Calculation (Shepard) 45 Shepard   Activity Tolerance Patient tolerated treatment well   Behavior During Therapy [facility] for tasks assessed/performed      Past Medical History  Diagnosis Date  . Yeast infection   . Fibroids 2012  . Sickle cell trait ([place])   . Bladder infection   . Seasonal allergies   . History of blood transfusion 1991    [facility]    Past Surgical History  Procedure Laterality Date  . Cesarean section  [phone]    x 3  . Eye muscle surgery    . Tonsillectomy and adenoidectomy    . Tubal ligation    . Wisdom tooth extraction    . Robotic assisted total hysterectomy Bilateral 07/10/2012    Procedure: ROBOTIC ASSISTED TOTAL HYSTERECTOMY WITH BILATERAL SALPINGECTOMY;  Surgeon: Alwyn Pea, MD;  Location: [facility];  Service: Gynecology;  Laterality: Bilateral;  . Abdominal hysterectomy      There were no vitals filed for this visit.      Subjective Assessment - 09/15/15 0802    Subjective "The last three days has been crucial" Pt reports going to the MD last week, MD informed her to continue therapy and gave her more pills.   Currently in Pain? Yes   Pain Score 6    Pain Location Neck   Pain Orientation Mid   Pain Descriptors / Indicators [place]            [facility] PT Assessment - 09/15/15 0001    AROM   Overall AROM Comments cerv flexion WFL, Ext & RT rotation decreased 25%, left rotation and SB WFL   Strength   Overall Strength Comments  5/5                     [facility] Adult PT Treatment/Exercise - 09/15/15 0001    Neck Exercises: Machines for Strengthening   UBE (Upper Arm Bike) L 3 3 frd/3rev   Cybex Row 35# 2 sets 15   Other Machines for Strengthening lat pull 35# 2 sets 15   Neck Exercises: Theraband   Horizontal ADduction 15 reps  x2   Horizontal ADduction Limitations red Tband    Other Theraband Exercises OHP with blue ball 2x15   Neck Exercises: Supine   Other Supine Exercise 3 way retraction 2x15   Ultrasound   Ultrasound Location Upper L trap   Ultrasound Parameters 1MHz 1.2w/cm2   Ultrasound Goals Pain   Manual Therapy   Manual Therapy Soft tissue mobilization;Passive ROM;Muscle Energy Technique   Soft tissue mobilization cerv/trap   Passive ROM Cervical spine all directions                  PT Short Term Goals - 08/04/15 0803    PT SHORT TERM GOAL #1   Title independent with initial HEP   Status Achieved           PT  Long Term Goals - 08/16/15 0820    PT LONG TERM GOAL #3   Title report 50% easier to back up the car   Status Achieved   PT LONG TERM GOAL #4   Title understand posture and body mechanics   Status Partially Met               Plan - 09/15/15 0844    Clinical Impression Statement Pt has no improvement in cervical ROM. Pt reports increase in neck pain over the last three days.Completed all of today's interventions well, continues to have tightness in upper trap area on both sides.    Rehab Potential Good   PT Frequency 2x / week   PT Duration 8 weeks   PT Treatment/Interventions Moist Heat;Electrical Stimulation;ADLs/Self Care Home Management;Ultrasound;Therapeutic activities;Therapeutic exercise;Manual techniques;Patient/family education;Taping   PT Next Visit Plan [place] as tolerated       Patient will benefit from skilled therapeutic intervention in order to improve the following deficits and impairments:  Decreased range of motion,  Decreased strength, Increased muscle spasms, Impaired flexibility, Postural dysfunction, Improper body mechanics, Pain  Visit Diagnosis: Cramp and spasm  Cervicalgia     Problem List Patient Active Problem List   Diagnosis Date Noted  . Neck strain 06/22/2015  . Weight gain 07/28/2014  . Nevus 02/13/2012  . Sickle cell trait ([place])     [name], PTA  09/15/2015, 8:47 AM  [place], [place], [address] Phone: [phone]   Fax:  [phone]  Name: Sheri Shepard MRN: [id number] Date of Birth: 09/10/1965

## 2015-09-22 ENCOUNTER — Ambulatory Visit: Payer: 59 | Admitting: Physical Therapy

## 2015-09-27 ENCOUNTER — Ambulatory Visit: Payer: 59 | Admitting: Physical Therapy

## 2015-10-06 ENCOUNTER — Ambulatory Visit: Payer: 59 | Admitting: Physical Therapy

## 2015-10-12 ENCOUNTER — Ambulatory Visit: Payer: 59 | Attending: Family Medicine | Admitting: Physical Therapy

## 2015-10-12 ENCOUNTER — Encounter: Payer: Self-pay | Admitting: Physical Therapy

## 2015-10-12 DIAGNOSIS — R252 Cramp and spasm: Secondary | ICD-10-CM | POA: Diagnosis present

## 2015-10-12 DIAGNOSIS — M542 Cervicalgia: Secondary | ICD-10-CM | POA: Insufficient documentation

## 2015-10-12 NOTE — Therapy (Signed)
[place], [place], [address] Phone: [phone]   Fax:  [phone]  Physical Therapy Treatment  Patient Details  Name: Sheri Shepard MRN: [id number] Date of Birth: 06/20/1965 Referring Provider: [name]  Encounter Date: 10/12/2015      PT End of Session - 10/12/15 0848    Visit Number 13   Date for PT Re-Evaluation 10/15/15   PT Start Time 0800   PT Stop Time 0900   PT Time Calculation (min) 60 min   Activity Tolerance Patient tolerated treatment well   Behavior During Therapy [facility] for tasks assessed/performed      Past Medical History:  Diagnosis Date  . Bladder infection   . Fibroids 2012  . History of blood transfusion 1991   [facility]  . Seasonal allergies   . Sickle cell trait ([name])   . Yeast infection     Past Surgical History:  Procedure Laterality Date  . ABDOMINAL HYSTERECTOMY    . CESAREAN SECTION  [id number]   x 3  . EYE MUSCLE SURGERY    . ROBOTIC ASSISTED TOTAL HYSTERECTOMY Bilateral 07/10/2012   Procedure: ROBOTIC ASSISTED TOTAL HYSTERECTOMY WITH BILATERAL SALPINGECTOMY;  Surgeon: Alwyn Pea, MD;  Location: [facility];  Service: Gynecology;  Laterality: Bilateral;  . TONSILLECTOMY AND ADENOIDECTOMY    . TUBAL LIGATION    . WISDOM TOOTH EXTRACTION      There were no vitals filed for this visit.      Subjective Assessment - 10/12/15 0802    Subjective "Its been ok, still some pain sometimes, overall no pain has been higher than a 6"   Currently in Pain? Yes   Pain Score 3    Pain Location Shoulder            [facility] PT Assessment - 10/12/15 0001      AROM   Overall AROM Comments Cervical ROM WFL     Strength   Overall Strength Comments 5/5                     [facility] Adult PT Treatment/Exercise - 10/12/15 0001      Neck Exercises: Machines for Strengthening   UBE (Upper Arm Bike) L 3 3  frd/3rev   Cybex Row 35# 2 sets 15   Other Machines for Strengthening lat pull 35# 2 sets 15   Other Machines for Strengthening Chest press 25lb 2x15      Neck Exercises: Theraband   Horizontal ADduction 15 reps   Horizontal ADduction Limitations red Tband    Other Theraband Exercises OHP with blue ball 2x15     Neck Exercises: Standing   Wall Push Ups 15 reps  x2   Other Standing Exercises Shoulder ER red Tband 2x15   Other Standing Exercises Standing OHP with blue ball 2x15; Standing front raises 3lb 2x10      Neck Exercises: Seated   Shoulder Shrugs 15 reps  10lb x2   Other Seated Exercise Seated OHP 6lb 2x15     Modalities   Modalities Electrical Stimulation;Moist Heat     Moist Heat Therapy   Number Minutes Moist Heat 15 Minutes   Moist Heat Location Shoulder;Cervical     Electrical Stimulation   Electrical Stimulation Location cervi   Printmaker Action Pre-mod    Electrical Stimulation Parameters sitting to pt tolerance   Electrical Stimulation Goals Pain  PT Short Term Goals - 08/04/15 0803      PT SHORT TERM GOAL #1   Title independent with initial HEP   Status Achieved           PT Long Term Goals - 10/12/15 0849      PT LONG TERM GOAL #1   Title decrease pain 50%   Status Partially Met     PT LONG TERM GOAL #2   Title increase cervical ROM 25%   Status Achieved     PT LONG TERM GOAL #3   Title report 50% easier to back up the car   Status Achieved     PT LONG TERM GOAL #4   Title understand posture and body mechanics   Status Achieved               Plan - 10/12/15 0849    Clinical Impression Statement Pt has progressed increasing her cervical ROM to [facility]. Reports that she feels better overall but continues to have pain. Tightness does increase with today's interventions in upper traps ands shoulders.    Rehab Potential Good   PT Frequency 2x / week   PT Duration 8 weeks   PT  Treatment/Interventions Moist Heat;Electrical Stimulation;ADLs/Self Care Home Management;Ultrasound;Therapeutic activities;Therapeutic exercise;Manual techniques;Patient/family education;Taping   PT Next Visit Plan porgress as tolerated       Patient will benefit from skilled therapeutic intervention in order to improve the following deficits and impairments:  Decreased range of motion, Decreased strength, Increased muscle spasms, Impaired flexibility, Postural dysfunction, Improper body mechanics, Pain  Visit Diagnosis: Cramp and spasm  Cervicalgia     Problem List Patient Active Problem List   Diagnosis Date Noted  . Neck strain 06/22/2015  . Weight gain 07/28/2014  . Nevus 02/13/2012  . Sickle cell trait ([name])     [name], PTA  10/12/2015, 8:52 AM  [place], [place], [address] Phone: [phone]   Fax:  [phone]  Name: Sheri Shepard MRN: [id number] Date of Birth: [birth year]
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
## 2015-10-18 ENCOUNTER — Ambulatory Visit (INDEPENDENT_AMBULATORY_CARE_PROVIDER_SITE_OTHER): Payer: Self-pay | Admitting: Family Medicine

## 2015-10-18 ENCOUNTER — Encounter: Payer: Self-pay | Admitting: Family Medicine

## 2015-10-18 VITALS — BP 131/74 | HR 90 | Temp 98.6°F | Wt 174.0 lb

## 2015-10-18 DIAGNOSIS — S161XXD Strain of muscle, fascia and tendon at neck level, subsequent encounter: Secondary | ICD-10-CM

## 2015-10-18 DIAGNOSIS — M542 Cervicalgia: Secondary | ICD-10-CM

## 2015-10-18 NOTE — Patient Instructions (Signed)
Thank you so much for coming to visit today! We will obtain a MRI of your neck to further evaluate your neck numbness. Please initiate the Gabapentin (Neurontin) that Dr. [name] prescribed.  Dr. Gerlean Ren
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
## 2015-10-20 ENCOUNTER — Encounter: Payer: Self-pay | Admitting: Physical Therapy

## 2015-10-20 ENCOUNTER — Ambulatory Visit: Payer: 59 | Admitting: Physical Therapy

## 2015-10-20 DIAGNOSIS — R252 Cramp and spasm: Secondary | ICD-10-CM

## 2015-10-20 DIAGNOSIS — M542 Cervicalgia: Secondary | ICD-10-CM

## 2015-10-20 NOTE — Therapy (Signed)
[place], [place], [address] Phone: [phone]   Fax:  [phone]  Physical Therapy Treatment  Patient Details  Name: Sheri Shepard MRN: [id number] Date of Birth: [birth year] Referring Provider: [name]  Encounter Date: 10/20/2015      PT End of Session - 10/20/15 0840    Visit Number 14   Date for PT Re-Evaluation 10/15/15   PT Start Time 0800   PT Stop Time 6378   PT Time Calculation (min) 54 min      Past Medical History:  Diagnosis Date  . Bladder infection   . Fibroids 2012  . History of blood transfusion 1991   [facility]  . Seasonal allergies   . Sickle cell trait ([place])   . Yeast infection     Past Surgical History:  Procedure Laterality Date  . ABDOMINAL HYSTERECTOMY    . CESAREAN SECTION  [id number]   x 3  . EYE MUSCLE SURGERY    . ROBOTIC ASSISTED TOTAL HYSTERECTOMY Bilateral 07/10/2012   Procedure: ROBOTIC ASSISTED TOTAL HYSTERECTOMY WITH BILATERAL SALPINGECTOMY;  Surgeon: Alwyn Pea, MD;  Location: [facility];  Service: Gynecology;  Laterality: Bilateral;  . TONSILLECTOMY AND ADENOIDECTOMY    . TUBAL LIGATION    . WISDOM TOOTH EXTRACTION      There were no vitals filed for this visit.      Subjective Assessment - 10/20/15 0803    Subjective "Not good I went to the doctor Wednesday, I was telling her about the pain and the center, and numbness and tingling is coming more frequently" "Their finally going to do the MRI Monday"   Pain Score 5    Pain Location Neck   Pain Orientation Mid            [facility] PT Assessment - 10/20/15 0001      AROM   Overall AROM Comments Cervical ROM decreased 25% with R rotation and extension                     [facility] Adult PT Treatment/Exercise - 10/20/15 0001      Neck Exercises: Machines for Strengthening   UBE (Upper Arm Bike) L 3 3 frd/3rev   Cybex Row 25#  2 sets 15   Other Machines for Strengthening lat pull 25# 2 sets 15   Other Machines for Strengthening Chest press 25lb 2x15      Neck Exercises: Theraband   Other Theraband Exercises OHP with blue ball 2x15     Neck Exercises: Standing   Wall Push Ups 15 reps  x2   Other Standing Exercises Ball vs wall presses CW/CCW x7 each   Other Standing Exercises Standing OHP with blue ball 2x15; Standing front raises 3lb 2x10      Neck Exercises: Seated   Shoulder Shrugs 15 reps  x2 with rev shoulder rolls      Modalities   Modalities Electrical Stimulation;Moist Heat     Moist Heat Therapy   Number Minutes Moist Heat 15 Minutes   Moist Heat Location Shoulder;Cervical     Electrical Stimulation   Electrical Stimulation Location cervi   Printmaker Action pre - mod   Electrical Stimulation Parameters sitting    Electrical Stimulation Goals Pain                  PT Short Term Goals - 08/04/15 7867  PT SHORT TERM GOAL #1   Title independent with initial HEP   Status Achieved           PT Long Term Goals - 10/20/15 0840      PT LONG TERM GOAL #1   Title decrease pain 50%   Status On-going     PT LONG TERM GOAL #2   Title increase cervical ROM 25%   Status Partially Met               Plan - 10/20/15 0842    Clinical Impression Statement Pt with a regression with cervical ROM. Pt reports frequent numbness and tingling in RUE during todays exercises, despite complaints able to complete all interventions. She reports that she has an MRI scheduled for Monday.   Rehab Potential Good   PT Frequency 2x / week   PT Duration 8 weeks   PT Treatment/Interventions Moist Heat;Electrical Stimulation;ADLs/Self Care Home Management;Ultrasound;Therapeutic activities;Therapeutic exercise;Manual techniques;Patient/family education;Taping   PT Next Visit Plan porgress as tolerated, avoiding numbness in RUE        Patient will benefit from skilled  therapeutic intervention in order to improve the following deficits and impairments:  Decreased range of motion, Decreased strength, Increased muscle spasms, Impaired flexibility, Postural dysfunction, Improper body mechanics, Pain  Visit Diagnosis: Cervicalgia  Cramp and spasm     Problem List Patient Active Problem List   Diagnosis Date Noted  . Neck strain 06/22/2015  . Weight gain 07/28/2014  . Nevus 02/13/2012  . Sickle cell trait ([place])     [name], PTA  10/20/2015, 8:44 AM  [place], [place], [address] Phone: [phone]   Fax:  [phone]  Name: Aliahna Statzer MRN: [id number] Date of Birth: 12/08/1965

## 2015-10-22 ENCOUNTER — Telehealth: Payer: Self-pay | Admitting: Internal Medicine

## 2015-10-22 NOTE — Telephone Encounter (Signed)
**  After Hours/ Emergency Line Call*  On Monday supposed to go have a MRI at [place]. She reports she is claustrophobic and is worried about the imaging. She initially asked if she can be prescribed valium or xanax. She has never taken these medications before. Upon further discussion, she is hesitant to use a medication for the imaging and reported maybe being blind folded may help with her claustrophobia. Patient will try blind folding for her MRI.   Smiley Houseman, MD PGY-2, [facility]

## 2015-10-24 ENCOUNTER — Ambulatory Visit (HOSPITAL_COMMUNITY)
Admission: RE | Admit: 2015-10-24 | Discharge: 2015-10-24 | Disposition: A | Discharge status: Home / Self Care | Payer: 59 | Source: Ambulatory Visit | Attending: Family Medicine | Admitting: Family Medicine

## 2015-10-24 DIAGNOSIS — M47812 Spondylosis without myelopathy or radiculopathy, cervical region: Secondary | ICD-10-CM | POA: Insufficient documentation

## 2015-10-24 DIAGNOSIS — M50222 Other cervical disc displacement at C5-C6 level: Secondary | ICD-10-CM | POA: Insufficient documentation

## 2015-10-24 DIAGNOSIS — M542 Cervicalgia: Secondary | ICD-10-CM | POA: Diagnosis present

## 2015-10-24 DIAGNOSIS — M50223 Other cervical disc displacement at C6-C7 level: Secondary | ICD-10-CM | POA: Insufficient documentation

## 2015-10-26 ENCOUNTER — Ambulatory Visit (INDEPENDENT_AMBULATORY_CARE_PROVIDER_SITE_OTHER): Payer: Self-pay | Admitting: Family Medicine

## 2015-10-26 ENCOUNTER — Encounter: Payer: Self-pay | Admitting: Family Medicine

## 2015-10-26 VITALS — BP 133/76 | HR 83 | Temp 98.3°F | Wt 174.0 lb

## 2015-10-26 DIAGNOSIS — R200 Anesthesia of skin: Secondary | ICD-10-CM

## 2015-10-26 DIAGNOSIS — R350 Frequency of micturition: Secondary | ICD-10-CM

## 2015-10-26 DIAGNOSIS — S161XXD Strain of muscle, fascia and tendon at neck level, subsequent encounter: Secondary | ICD-10-CM

## 2015-10-26 DIAGNOSIS — R208 Other disturbances of skin sensation: Secondary | ICD-10-CM

## 2015-10-26 LAB — POCT URINALYSIS DIPSTICK
Bilirubin, UA: NEGATIVE
Glucose, UA: NEGATIVE
KETONES UA: NEGATIVE
Leukocytes, UA: NEGATIVE
Nitrite, UA: NEGATIVE
PH UA: 5
PROTEIN UA: NEGATIVE
SPEC GRAV UA: 1.02
Urobilinogen, UA: 0.2

## 2015-10-26 LAB — POCT UA - MICROSCOPIC ONLY
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
## 2015-10-26 NOTE — Patient Instructions (Addendum)
Good to see you today!  Thanks for coming in.  Try to increase the gabapentin to 2 at night and then if needed 3   If any worsening or the numbness or any weakness then call me and we will refer your to a neurologist  Come back in 2-3 months if your are not doing better

## 2015-10-26 NOTE — Progress Notes (Signed)
Subjective  Patient is presenting with the following illnesses  Finger Numbness Continues to have neck pain and feeling of stiffness but has noticed over the last 1-2 months numbness in her fingers R>L.   Has to rub her fingers to make them feels better.  Maybe some mild diffuse weakness is not sure.  No dropping things or clumsiness.  No changes in her LE.   No rash. No incontinence  Recent MRI - mild impingement  Chief Complaint noted Review of Symptoms - see HPI PMH - Smoking status noted.     Objective Vital Signs reviewed Neck - FROM with moderate pain.  No focal pain or vertebral changes Strength - 5/5 bilateral upper and lower extrm including grip and lumbricals Shoulders - FROM Hands - no skin changes or loss of muscle    Assessments/Plans  No problem-specific Assessment & Plan notes found for this encounter.   See Encounter view if individual problem A/Ps not visible See after visit summary for details of patient instuctions

## 2015-10-26 NOTE — Assessment & Plan Note (Signed)
Stable.  Continue Physical Therapy.  Slowly increase Neurontin.   No signs of nerve or spine damage on MRI

## 2015-10-27 ENCOUNTER — Ambulatory Visit: Payer: 59 | Admitting: Physical Therapy

## 2015-11-03 ENCOUNTER — Ambulatory Visit: Payer: 59 | Attending: Family Medicine | Admitting: Physical Therapy

## 2015-11-03 ENCOUNTER — Encounter: Payer: Self-pay | Admitting: Physical Therapy

## 2015-11-03 DIAGNOSIS — R252 Cramp and spasm: Secondary | ICD-10-CM | POA: Insufficient documentation

## 2015-11-03 DIAGNOSIS — M542 Cervicalgia: Secondary | ICD-10-CM

## 2015-11-03 NOTE — Therapy (Signed)
[place], [place], [address] Phone: [phone]   Fax:  [phone]  Physical Therapy Treatment  Patient Details  Name: Sheri Shepard MRN: [id number] Date of Birth: Dec 17, 1965 Referring Provider: [name]  Encounter Date: 11/03/2015      PT End of Session - 11/03/15 0842    Visit Number 15   Date for PT Re-Evaluation 11/15/15   PT Start Time 0756   PT Stop Time 0842   PT Time Calculation (min) 46 min   Activity Tolerance Patient tolerated treatment well   Behavior During Therapy [facility] for tasks assessed/performed      Past Medical History:  Diagnosis Date  . Bladder infection   . Fibroids 2012  . History of blood transfusion 1991   [facility]  . Seasonal allergies   . Sickle cell trait ([place])   . Yeast infection     Past Surgical History:  Procedure Laterality Date  . ABDOMINAL HYSTERECTOMY    . CESAREAN SECTION  [id number]   x 3  . EYE MUSCLE SURGERY    . ROBOTIC ASSISTED TOTAL HYSTERECTOMY Bilateral 07/10/2012   Procedure: ROBOTIC ASSISTED TOTAL HYSTERECTOMY WITH BILATERAL SALPINGECTOMY;  Surgeon: Alwyn Pea, MD;  Location: [facility];  Service: Gynecology;  Laterality: Bilateral;  . TONSILLECTOMY AND ADENOIDECTOMY    . TUBAL LIGATION    . WISDOM TOOTH EXTRACTION      There were no vitals filed for this visit.      Subjective Assessment - 11/03/15 0758    Subjective "The Doc wants you to work on my arms"   Currently in Pain? Yes   Pain Score 5    Pain Location Arm   Pain Orientation Right;Left                         [facility] Adult PT Treatment/Exercise - 11/03/15 0001      Neck Exercises: Machines for Strengthening   UBE (Upper Arm Bike) L 4.5 3 frd/3rev   Cybex Row 35# 3 sets 10   Other Machines for Strengthening lat pull 25# 2 sets 15   Other Machines for Strengthening Chest press 25lb 2x15      Neck  Exercises: Theraband   Rows 20 reps   Rows Limitations green Tband    Horizontal ADduction 15 reps  x2   Horizontal ADduction Limitations blue  Tband    Other Theraband Exercises shoulder abduction 3lb 2x15    Other Theraband Exercises Alt dumbbell OHP 6lb 2x10     Neck Exercises: Standing   Wall Push Ups 15 reps  x2   Other Standing Exercises Tri's & Bi's 35lb 2x15    Other Standing Exercises shoulder ER back against wall green Tband 2x15; Standing straight arm pull downs 25lb 2x15                  PT Short Term Goals - 08/04/15 0803      PT SHORT TERM GOAL #1   Title independent with initial HEP   Status Achieved           PT Long Term Goals - 11/03/15 0839      PT LONG TERM GOAL #1   Title decrease pain 50%   Status On-going     PT LONG TERM GOAL #2   Title increase cervical ROM 25%   Status Partially Met  PT LONG TERM GOAL #3   Title report 50% easier to back up the car   Status Achieved     PT LONG TERM GOAL #4   Title understand posture and body mechanics   Status Achieved               Plan - 11/03/15 0842    Clinical Impression Statement Pt with a new c/o numbness and tingling that originates in her  shoulder and does down both UE. Pt reports no functional limitations but does have discomfort in both UE. Pt Able to complete all of today's interventions well but constantly having to rub her arms and the back of hands cue to numbness and tingling sensation. Pt reports that MD informed her that she has to swollen areas on her spine.   Rehab Potential Good   PT Frequency 2x / week   PT Duration 8 weeks   PT Treatment/Interventions Moist Heat;Electrical Stimulation;ADLs/Self Care Home Management;Ultrasound;Therapeutic activities;Therapeutic exercise;Manual techniques;Patient/family education;Taping   PT Next Visit Plan porgress as tolerated, avoiding numbness in RUE        Patient will benefit from skilled therapeutic intervention in  order to improve the following deficits and impairments:  Decreased range of motion, Decreased strength, Increased muscle spasms, Impaired flexibility, Postural dysfunction, Improper body mechanics, Pain  Visit Diagnosis: Cervicalgia  Cramp and spasm     Problem List Patient Active Problem List   Diagnosis Date Noted  . Hand numbness 10/26/2015  . Neck strain 06/22/2015  . Weight gain 07/28/2014  . Nevus 02/13/2012  . Sickle cell trait ([place])     [name], PTA  11/03/2015, 8:46 AM  [place], [place], [address] Phone: [phone]   Fax:  [phone]  Name: Sheri Shepard MRN: [id number] Date of Birth: 03-May-1965

## 2015-11-10 ENCOUNTER — Ambulatory Visit: Payer: 59 | Admitting: Physical Therapy

## 2015-11-25 ENCOUNTER — Ambulatory Visit: Payer: 59 | Admitting: Physical Therapy

## 2015-11-25 ENCOUNTER — Encounter: Payer: Self-pay | Admitting: Physical Therapy

## 2015-11-25 DIAGNOSIS — M542 Cervicalgia: Secondary | ICD-10-CM | POA: Diagnosis not present

## 2015-11-25 DIAGNOSIS — R252 Cramp and spasm: Secondary | ICD-10-CM

## 2015-11-25 NOTE — Therapy (Signed)
[place], [place], [address] Phone: [phone]   Fax:  [phone]  Physical Therapy Treatment  Patient Details  Name: Sheri Shepard MRN: [id number] Date of Birth: [birth year] Referring Provider: [name]  Encounter Date: 11/25/2015      PT End of Session - 11/25/15 0928    Visit Number 16   Date for PT Re-Evaluation 12/15/15   PT Start Time 0845   PT Stop Time 0929   PT Time Calculation (min) 44 min   Activity Tolerance Patient tolerated treatment well   Behavior During Therapy [facility] for tasks assessed/performed      Past Medical History:  Diagnosis Date  . Bladder infection   . Fibroids 2012  . History of blood transfusion 1991   [facility]  . Seasonal allergies   . Sickle cell trait ([place])   . Yeast infection     Past Surgical History:  Procedure Laterality Date  . ABDOMINAL HYSTERECTOMY    . CESAREAN SECTION  [phone]   x 3  . EYE MUSCLE SURGERY    . ROBOTIC ASSISTED TOTAL HYSTERECTOMY Bilateral 07/10/2012   Procedure: ROBOTIC ASSISTED TOTAL HYSTERECTOMY WITH BILATERAL SALPINGECTOMY;  Surgeon: Alwyn Pea, MD;  Location: [facility];  Service: Gynecology;  Laterality: Bilateral;  . TONSILLECTOMY AND ADENOIDECTOMY    . TUBAL LIGATION    . WISDOM TOOTH EXTRACTION      There were no vitals filed for this visit.      Subjective Assessment - 11/25/15 0854    Subjective "I still have numbness in my R arm, but the neck and back are good"   Currently in Pain? Yes   Pain Score 7    Pain Location Arm   Pain Orientation Right                         [facility] Adult PT Treatment/Exercise - 11/25/15 0001      Neck Exercises: Machines for Strengthening   UBE (Upper Arm Bike) L 4.5 3 frd/3rev   Cybex Row 35# 3 sets 10   Other Machines for Strengthening lat pull 35# 3 sets 10   Other Machines for Strengthening Chest  press 25lb 2x15      Neck Exercises: Theraband   Rows 20 reps   Rows Limitations green Tband    Horizontal ADduction 20 reps   Horizontal ADduction Limitations blue  Tband    Other Theraband Exercises shoulder abduction 3lb 2x15    Other Theraband Exercises Alt dumbbell OHP 6lb 2x10     Neck Exercises: Standing   Wall Push Ups 15 reps  x2   Other Standing Exercises Tri's & Bi's 35lb 2x15    Other Standing Exercises shoulder ER back against wall green Tband 2x15; Standing straight arm pull downs 25lb 2x15                  PT Short Term Goals - 08/04/15 0803      PT SHORT TERM GOAL #1   Title independent with initial HEP   Status Achieved           PT Long Term Goals - 11/25/15 0932      PT LONG TERM GOAL #1   Title decrease pain 50%   Status Partially Met     PT LONG TERM GOAL #2   Title increase cervical ROM 25%  PT LONG TERM GOAL #3   Title report 50% easier to back up the car   Status Achieved     PT LONG TERM GOAL #4   Title understand posture and body mechanics   Status Achieved               Plan - 11/25/15 0929    Clinical Impression Statement Pt reports improvement with her neck and back, but numbness remains in RUE. Pt able to complete all of today's exercises wit only c/o soreness as the therapy progressed.    Rehab Potential Good   PT Frequency 2x / week   PT Duration 8 weeks   PT Next Visit Plan progress as tolerated, avoiding numbness in RUE        Patient will benefit from skilled therapeutic intervention in order to improve the following deficits and impairments:  Decreased range of motion, Decreased strength, Increased muscle spasms, Impaired flexibility, Postural dysfunction, Improper body mechanics, Pain  Visit Diagnosis: Cervicalgia  Cramp and spasm     Problem List Patient Active Problem List   Diagnosis Date Noted  . Hand numbness 10/26/2015  . Neck strain 06/22/2015  . Weight gain 07/28/2014  . Nevus  02/13/2012  . Sickle cell trait ([place])     [name] 11/25/2015, 9:34 AM  [place], [place], [address] Phone: [phone]   Fax:  [phone]  Name: Sheri Shepard MRN: [id number] Date of Birth: 06-Mar-1965

## 2015-11-30 ENCOUNTER — Ambulatory Visit: Payer: 59 | Admitting: Physical Therapy

## 2015-11-30 ENCOUNTER — Encounter: Payer: Self-pay | Admitting: Physical Therapy

## 2015-11-30 ENCOUNTER — Ambulatory Visit: Payer: 59 | Attending: Family Medicine | Admitting: Physical Therapy

## 2015-11-30 DIAGNOSIS — M542 Cervicalgia: Secondary | ICD-10-CM | POA: Diagnosis present

## 2015-11-30 DIAGNOSIS — R252 Cramp and spasm: Secondary | ICD-10-CM | POA: Diagnosis present

## 2015-11-30 NOTE — Therapy (Signed)
[facility] [address] [place], [place], [address] Phone: [phone]   Fax:  [phone]  Physical Therapy Treatment  Patient Details  Name: Sheri Shepard MRN: [id number] Date of Birth: 05/07/1965 Referring Provider: Chambliss  Encounter Date: 11/30/2015      PT End of Session - 11/30/15 1009    Visit Number 17   Date for PT Re-Evaluation 12/15/15   PT Start Time 0923   PT Stop Time 1010   PT Time Calculation (min) 47 min   Activity Tolerance Patient tolerated treatment well   Behavior During Therapy WFL for tasks assessed/performed      Past Medical History:  Diagnosis Date  . Bladder infection   . Fibroids 2012  . History of blood transfusion 1991   unsure of [facility]  . Seasonal allergies   . Sickle cell trait (HCC)   . Yeast infection     Past Surgical History:  Procedure Laterality Date  . ABDOMINAL HYSTERECTOMY    . CESAREAN SECTION  1988,1991,1994   x 3  . EYE MUSCLE SURGERY    . ROBOTIC ASSISTED TOTAL HYSTERECTOMY Bilateral 07/10/2012   Procedure: ROBOTIC ASSISTED TOTAL HYSTERECTOMY WITH BILATERAL SALPINGECTOMY;  Surgeon: Sandra A Rivard, MD;  Location: [facility];  Service: Gynecology;  Laterality: Bilateral;  . TONSILLECTOMY AND ADENOIDECTOMY    . TUBAL LIGATION    . WISDOM TOOTH EXTRACTION      There were no vitals filed for this visit.      Subjective Assessment - 11/30/15 0926    Subjective "Numbness not as bad"   Currently in Pain? Yes   Pain Score 4    Pain Location Arm   Pain Orientation Right                         [facility] Adult PT Treatment/Exercise - 11/30/15 0001      Neck Exercises: Machines for Strengthening   UBE (Upper Arm Bike) L 4.5 3 frd/3rev   Cybex Row 35# 3 sets 10   Other Machines for Strengthening lat pull 35# 3 sets 10   Other Machines for Strengthening Chest press 25lb 2x15      Neck Exercises: Theraband    Horizontal ADduction 20 reps   Horizontal ADduction Limitations blue  Tband    Other Theraband Exercises Alt dumbbell OHP 6lb 2x10     Neck Exercises: Standing   Wall Push Ups 15 reps  x2   Other Standing Exercises Tri's & Bi's 35lb 2x15    Other Standing Exercises shoulder ER back against wall green Tband 2x15; Standing straight arm pulldowns 25lb 2x15; Shoulder flex 4lb 2x15; shoulder abd 3lb 2x10                  PT Short Term Goals - 08/04/15 0803      PT SHORT TERM GOAL #1   Title independent with initial HEP   Status Achieved           PT Long Term Goals - 11/25/15 0932      PT LONG TERM GOAL #1   Title decrease pain 50%   Status Partially Met     PT LONG TERM GOAL #2   Title increase cervical ROM 25%     PT LONG TERM GOAL #3   Title report 50% easier to back up the car   Status Achieved       PT LONG TERM GOAL #4   Title understand posture and body mechanics   Status Achieved               Plan - 11/30/15 1010    Clinical Impression Statement Pt continues to reports numbness in RUE, demos good strength and ROM with exercises.     Rehab Potential Good   PT Frequency 2x / week   PT Duration 8 weeks   PT Treatment/Interventions Moist Heat;Electrical Stimulation;ADLs/Self Care Home Management;Ultrasound;Therapeutic activities;Therapeutic exercise;Manual techniques;Patient/family education;Taping   PT Next Visit Plan progress as tolerated, avoiding numbness in RUE; She return to MD 10/11        Patient will benefit from skilled therapeutic intervention in order to improve the following deficits and impairments:  Decreased range of motion, Decreased strength, Increased muscle spasms, Impaired flexibility, Postural dysfunction, Improper body mechanics, Pain  Visit Diagnosis: Cramp and spasm  Cervicalgia     Problem List Patient Active Problem List   Diagnosis Date Noted  . Hand numbness 10/26/2015  . Neck strain 06/22/2015  . Weight  gain 07/28/2014  . Nevus 02/13/2012  . Sickle cell trait (HCC)     Ronald G Pemberton 11/30/2015, 10:12 AM  [facility] [address] [place], [place], [address] Phone: [phone]   Fax:  [phone]  Name: Sheri Shepard MRN: [id number] Date of Birth: 11/22/1965    

## 2015-12-05 ENCOUNTER — Ambulatory Visit: Payer: 59 | Admitting: Physical Therapy

## 2015-12-07 ENCOUNTER — Ambulatory Visit (INDEPENDENT_AMBULATORY_CARE_PROVIDER_SITE_OTHER): Payer: Self-pay | Admitting: Family Medicine

## 2015-12-07 ENCOUNTER — Encounter: Payer: Self-pay | Admitting: Family Medicine

## 2015-12-07 DIAGNOSIS — R200 Anesthesia of skin: Secondary | ICD-10-CM

## 2015-12-07 MED ORDER — GABAPENTIN 100 MG PO CAPS: 100.0000 mg | ORAL_CAPSULE | Freq: Every day | ORAL | 1 refills | Status: DC

## 2015-12-07 NOTE — Patient Instructions (Signed)
Good to see you today!  Thanks for coming in.  For night time pain Take the Gabapentin 4 tabs at bed time can move up to total of 6 tabs before bed Continue to take 3 tabs in the AM  We will refer you to neurology.  If you do not hear from them about an appointment within 1 week then call us  If any worsening especially weakness of the hands or legs then call me

## 2015-12-07 NOTE — Assessment & Plan Note (Signed)
Not improving on the right.  Patient desires further work up.   Had negative MRI and symptoms are somewhat consistent with a peripheral nerve impingement possibly carpal tunnel.  Will increase gabapentin and refer to neurology

## 2015-12-07 NOTE — Progress Notes (Signed)
Subjective  Patient is presenting with the following illnesses  Hand numbness Continues to be bad in her right hand.  Has pain in the back of her neck and shoulder.  The numbness seems worse at night along with some pain in wrist.  No loss of strength or dexterity. Gabapentin not helping much.   No weight loss or fever    Chief Complaint noted Review of Symptoms - see HPI PMH - Smoking status noted.     Objective Vital Signs reviewed Neck - FROM no focal tenderness R Arm - FROM fingers feel numb to LT.  Normal grip and pulses   Assessments/Plans  No problem-specific Assessment & Plan notes found for this encounter.   See Encounter view if individual problem A/Ps not visible See after visit summary for details of patient instuctions

## 2015-12-08 ENCOUNTER — Telehealth: Payer: Self-pay | Admitting: Family Medicine

## 2015-12-08 NOTE — Telephone Encounter (Signed)
Will forward to referral coordinator to see if there might be another office patient can call and check with.  Sheri Shepard

## 2015-12-08 NOTE — Telephone Encounter (Signed)
Pt has a referral to neurology, but they are not able to get pt in until December. Pt would like to know if there is somewhere else she could go and get in sooner. Pt says in too much pain to wait. Please advise. Thanks! ep

## 2015-12-13 NOTE — Telephone Encounter (Signed)
Referral has now been sent to [facility] for scheduling. They are scheduling into late October, early November.

## 2015-12-14 ENCOUNTER — Telehealth: Payer: Self-pay | Admitting: Family Medicine
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
## 2015-12-28 ENCOUNTER — Ambulatory Visit: Payer: 59 | Admitting: Neurology

## 2015-12-31 ENCOUNTER — Ambulatory Visit (INDEPENDENT_AMBULATORY_CARE_PROVIDER_SITE_OTHER): Payer: 59 | Admitting: Family Medicine

## 2015-12-31 VITALS — BP 134/70 | HR 94 | Temp 97.9°F | Resp 18 | Ht 63.0 in | Wt 179.0 lb

## 2015-12-31 DIAGNOSIS — J0120 Acute ethmoidal sinusitis, unspecified: Secondary | ICD-10-CM | POA: Diagnosis not present

## 2015-12-31 MED ORDER — GUAIFENESIN ER 1200 MG PO TB12: 1.0000 | ORAL_TABLET | Freq: Two times a day (BID) | ORAL | 1 refills | Status: DC | PRN

## 2015-12-31 MED ORDER — AZITHROMYCIN 250 MG PO TABS: ORAL_TABLET | ORAL | 0 refills | Status: DC

## 2015-12-31 MED ORDER — IPRATROPIUM BROMIDE 0.03 % NA SOLN: 2.0000 | Freq: Four times a day (QID) | NASAL | 1 refills | Status: DC

## 2015-12-31 MED ORDER — CHLORPHENIRAMINE MALEATE 4 MG PO TABS: 4.0000 mg | ORAL_TABLET | ORAL | 1 refills | Status: DC | PRN

## 2015-12-31 MED ORDER — PROMETHAZINE-DM 6.25-15 MG/5ML PO SYRP: 5.0000 mL | ORAL_SOLUTION | Freq: Four times a day (QID) | ORAL | 0 refills | Status: DC | PRN

## 2015-12-31 NOTE — Patient Instructions (Addendum)
   IF you received an x-ray today, you will receive an invoice from [facility]. Please contact [facility] at [phone] with questions or concerns regarding your invoice.   IF you received labwork today, you will receive an invoice from [facility]. Please contact [facility] at [phone] with questions or concerns regarding your invoice.   Our billing staff will not be able to assist you with questions regarding bills from these companies.  You will be contacted with the lab results as soon as they are available. The fastest way to get your results is to activate your My Chart account. Instructions are located on the last page of this paperwork. If you have not heard from us regarding the results in 2 weeks, please contact this office.     Sinusitis, Adult Sinusitis is redness, soreness, and inflammation of the paranasal sinuses. Paranasal sinuses are air pockets within the bones of your face. They are located beneath your eyes, in the middle of your forehead, and above your eyes. In healthy paranasal sinuses, mucus is able to drain out, and air is able to circulate through them by way of your nose. However, when your paranasal sinuses are inflamed, mucus and air can become trapped. This can allow bacteria and other germs to grow and cause infection. Sinusitis can develop quickly and last only a short time (acute) or continue over a long period (chronic). Sinusitis that lasts for more than 12 weeks is considered chronic. CAUSES Causes of sinusitis include:  Allergies.  Structural abnormalities, such as displacement of the cartilage that separates your nostrils (deviated septum), which can decrease the air flow through your nose and sinuses and affect sinus drainage.  Functional abnormalities, such as when the small hairs (cilia) that line your sinuses and help remove mucus do not work properly or are not present. SIGNS AND SYMPTOMS Symptoms  of acute and chronic sinusitis are the same. The primary symptoms are pain and pressure around the affected sinuses. Other symptoms include:  Upper toothache.  Earache.  Headache.  Bad breath.  Decreased sense of smell and taste.  A cough, which worsens when you are lying flat.  Fatigue.  Fever.  Thick drainage from your nose, which often is green and may contain pus (purulent).  Swelling and warmth over the affected sinuses. DIAGNOSIS Your health care provider will perform a physical exam. During your exam, your health care provider may perform any of the following to help determine if you have acute sinusitis or chronic sinusitis:  Look in your nose for signs of abnormal growths in your nostrils (nasal polyps).  Tap over the affected sinus to check for signs of infection.  View the inside of your sinuses using an imaging device that has a light attached (endoscope). If your health care provider suspects that you have chronic sinusitis, one or more of the following tests may be recommended:  Allergy tests.  Nasal culture. A sample of mucus is taken from your nose, sent to a lab, and screened for bacteria.  Nasal cytology. A sample of mucus is taken from your nose and examined by your health care provider to determine if your sinusitis is related to an allergy. TREATMENT Most cases of acute sinusitis are related to a viral infection and will resolve on their own within 10 days. Sometimes, medicines are prescribed to help relieve symptoms of both acute and chronic sinusitis. These may include pain medicines, decongestants, nasal steroid sprays, or saline sprays. However, for sinusitis related   to a bacterial infection, your health care provider will prescribe antibiotic medicines. These are medicines that will help kill the bacteria causing the infection. Rarely, sinusitis is caused by a fungal infection. In these cases, your health care provider will prescribe antifungal  medicine. For some cases of chronic sinusitis, surgery is needed. Generally, these are cases in which sinusitis recurs more than 3 times per year, despite other treatments. HOME CARE INSTRUCTIONS  Drink plenty of water. Water helps thin the mucus so your sinuses can drain more easily.  Use a humidifier.  Inhale steam 3-4 times a day (for example, sit in the bathroom with the shower running).  Apply a warm, moist washcloth to your face 3-4 times a day, or as directed by your health care provider.  Use saline nasal sprays to help moisten and clean your sinuses.  Take medicines only as directed by your health care provider.  If you were prescribed either an antibiotic or antifungal medicine, finish it all even if you start to feel better. SEEK IMMEDIATE MEDICAL CARE IF:  You have increasing pain or severe headaches.  You have nausea, vomiting, or drowsiness.  You have swelling around your face.  You have vision problems.  You have a stiff neck.  You have difficulty breathing.   This information is not intended to replace advice given to you by your health care provider. Make sure you discuss any questions you have with your health care provider.   Document Released: 02/12/2005 Document Revised: 03/05/2014 Document Reviewed: 02/27/2011 Elsevier Interactive Patient Education 2016 Elsevier Inc.  

## 2015-12-31 NOTE — Progress Notes (Signed)
Subjective:  By signing my name below, I, Raven Small, attest that this documentation has been prepared under the direction and in the presence of Delman Cheadle, MD.  Electronically Signed: Thea Alken, ED Scribe. 12/31/2015. 11:41 AM.   Patient ID: Sheri Shepard, female    DOB: [birth year], 50 y.o.   MRN: [id number]  HPI Chief Complaint  Patient presents with  . Sinusitis    HPI Comments: Sheri Shepard is a 50 y.o. female who presents to the [facility] complaining of sinus congestion that started 4 days ago. She reports associated fever, productive cough consisting of thick mucous, postnasal drip, frontal and ethmoid sinus pressure and nausea due to drainage. She has tried mucinex and theraflu as well as cough drops and warm salt water gargling. She denies chills. She reports intolerance to penicillin including itchiness and trouble breathing as well as hydrocodone which causes itchiness as well.   Patient Active Problem List   Diagnosis Date Noted  . Hand numbness 10/26/2015  . Neck strain 06/22/2015  . Weight gain 07/28/2014  . Nevus 02/13/2012  . Sickle cell trait ([place])    Past Medical History:  Diagnosis Date  . Allergy   . Bladder infection   . Fibroids 2012  . History of blood transfusion 1991   unsure [facility]  . Seasonal allergies   . 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 Sickle cell trait ([place])   . Yeast infection    Past Surgical History:  Procedure Laterality Date  . ABDOMINAL HYSTERECTOMY    . CESAREAN SECTION  [phone]   x 3  . EYE MUSCLE SURGERY    . ROBOTIC ASSISTED TOTAL HYSTERECTOMY Bilateral 07/10/2012   Procedure: ROBOTIC ASSISTED TOTAL HYSTERECTOMY WITH BILATERAL SALPINGECTOMY;  Surgeon: Alwyn Pea, MD;  Location: [facility];  Service: Gynecology;  Laterality: Bilateral;  . TONSILLECTOMY AND ADENOIDECTOMY    . TUBAL LIGATION    . WISDOM TOOTH EXTRACTION     Allergies  Allergen Reactions   . Tramadol Hcl Shortness Of Breath and Itching  . Ciprofloxacin Itching  . Hydrocodone Itching  . Penicillins Hives and Itching  . Propoxyphene N-Acetaminophen Itching   Prior to Admission medications   Medication Sig Start Date End Date Taking? Authorizing Provider  Calcium Carbonate-Vitamin D (CALTRATE 600+D PO) Take 2 tablets by mouth daily. Reported on 08/10/2015   Yes Historical Provider, MD  gabapentin (NEURONTIN) 100 MG capsule Take 1 capsule (100 mg total) by mouth at bedtime. May increase to 6 tabs at night 12/07/15  Yes Lind Covert, MD  ibuprofen (ADVIL,MOTRIN) 200 MG tablet Take 200 mg by mouth every 6 (six) hours as needed. 2-4 tabs as needed   Yes Historical Provider, MD  Multiple Vitamins-Minerals (MULTIVITAMIN WITH MINERALS) tablet Take 1 tablet by mouth daily. Reported on 08/10/2015   Yes Historical Provider, MD   Social History   Social History  . Marital status: Single    Spouse name: N/A  . Number of children: N/A  . Years of education: N/A   Occupational History  . Not on file.   Social History Main Topics  . Smoking status: Former Smoker    Types: Cigars    Quit date: 02/26/2010  . Smokeless tobacco: Never Used  . Alcohol use 1.8 oz/week    2 Standard drinks or equivalent, 1 Glasses of wine per week     Comment: weekends  . Drug  use: No  . Sexual activity: Yes    Birth control/ protection: Surgical     Comment: BTL    Other Topics Concern  . Not on file   Social History Narrative  . No narrative on file   Review of Systems  Constitutional: Positive for fever. Negative for chills.  HENT: Positive for congestion and postnasal drip.   Respiratory: Positive for cough.   Gastrointestinal: Positive for nausea. Negative for vomiting.  Psychiatric/Behavioral: Positive for sleep disturbance.     Objective:   Physical Exam  Constitutional: She is oriented to person, place, and time. She appears well-developed and well-nourished. No distress.    HENT:  Head: Normocephalic.  Right Ear: External ear normal.  Left Ear: External ear normal.  Nose: Nose normal.  Mouth/Throat: Oropharynx is clear and moist.  Nares-Pale boggy mucosa right worse than left.   Eyes: Conjunctivae are normal.  Neck: No thyromegaly present.  Cardiovascular: Normal rate, regular rhythm and normal heart sounds.   No murmur heard. Pulmonary/Chest: Effort normal and breath sounds normal. She has no wheezes. She has no rales.  Lymphadenopathy:    She has no cervical adenopathy.  Neurological: She is alert and oriented to person, place, and time.  Skin: Skin is warm and dry.  Psychiatric: She has a normal mood and affect. Her behavior is normal.   Vitals:   12/31/15 1051  BP: 134/70  Pulse: 94  Resp: 18  Temp: 97.9 F (36.6 C)  TempSrc: Oral  SpO2: 99%  Weight: 179 lb (81.2 kg)  Height: 5\' 3"  (1.6 m)    Assessment & Plan:   1. Acute ethmoidal sinusitis, recurrence not specified     Meds ordered this encounter  Medications  . ipratropium (ATROVENT) 0.03 % nasal spray    Sig: Place 2 sprays into the nose 4 (four) times daily.    Dispense:  30 mL    Refill:  1  . promethazine-dextromethorphan (PROMETHAZINE-DM) 6.25-15 MG/5ML syrup    Sig: Take 5-10 mLs by mouth 4 (four) times daily as needed for cough.    Dispense:  180 mL    Refill:  0  . Guaifenesin (MUCINEX MAXIMUM STRENGTH) 1200 MG TB12    Sig: Take 1 tablet (1,200 mg total) by mouth every 12 (twelve) hours as needed.    Dispense:  14 tablet    Refill:  1  . chlorpheniramine (CHLOR-TRIMETON) 4 MG tablet    Sig: Take 1 tablet (4 mg total) by mouth every 4 (four) hours as needed for rhinitis.    Dispense:  14 tablet    Refill:  1  . azithromycin (ZITHROMAX) 250 MG tablet    Sig: Take 2 tabs PO x 1 dose, then 1 tab PO QD x 4 days    Dispense:  6 tablet    Refill:  0    I personally performed the services described in this documentation, which was scribed in my presence. The recorded  information has been reviewed and considered, and addended by me as needed.   Delman Cheadle, M.D.  [facility] [address] [place], [place] [address] [phone] phone [phone] fax  01/10/16 10:15 PM

## 2016-01-18 ENCOUNTER — Ambulatory Visit (INDEPENDENT_AMBULATORY_CARE_PROVIDER_SITE_OTHER): Payer: 59 | Admitting: Family Medicine

## 2016-01-18 ENCOUNTER — Encounter: Payer: Self-pay | Admitting: Family Medicine

## 2016-01-18 DIAGNOSIS — R200 Anesthesia of skin: Secondary | ICD-10-CM | POA: Diagnosis not present

## 2016-01-18 DIAGNOSIS — H6990 Unspecified Eustachian tube disorder, unspecified ear: Secondary | ICD-10-CM | POA: Diagnosis not present

## 2016-01-18 NOTE — Patient Instructions (Addendum)
Good to see you today!  Thanks for coming in.  Your right side should continue to improve and you should be able to go back to regular work and activities.   If not then go to see the neurologist.  Use the gabapentin as needed  If the nasal congestion is not improving use Afrin nasal spray for up to 3-4 to open up your sinuses.  If any fever or ear pain then you should be seen again you mighthave an infection  Aim for a weight of 160 - try to lose 1-2 lbs a week.  Be careful of the holidays

## 2016-01-18 NOTE — Assessment & Plan Note (Signed)
Seems to be improving.  See after visit summary.  Can go back to regular duties to notify us if unable

## 2016-01-18 NOTE — Assessment & Plan Note (Signed)
Seems due to recent URI.  No signs of bacterial infection.  Recommend OTC afrin as needed

## 2016-01-18 NOTE — Progress Notes (Signed)
Subjective  Patient is presenting with the following illnesses  Congestion - seen recently at [facility] care.  Did not get antibiotics or cough medications.  Feeling better except fullness in her ears.  No pain or fever or rash or shortness of breath   R arm pain Has been improving.  Was gone completely but came back mildly a few days ago.  Taking gabapentin as needed.  Did not feel she needed to see neurology since getting better.  No weakness or loss of sensation.  Mainly mild pain between neck and shoulder   Chief Complaint noted Review of Symptoms - see HPI PMH - Smoking status noted.     Objective Vital Signs reviewed Neck:  No deformities, thyromegaly, masses, or tenderness noted.   Supple with full range of motion without pain. R Shoulder- FROM no focal pain R grip and sensation normal in her hand TM - mild effusion bilaterally.  Not red or painful Neck:  No deformities, thyromegaly, masses, or tenderness noted.   Supple with full range of motion without pain. Lungs:  Normal respiratory effort, chest expands symmetrically. Lungs are clear to auscultation, no crackles or wheezes.     Assessments/Plans  No problem-specific Assessment & Plan notes found for this encounter.   See Encounter view if individual problem A/Ps not visible See after visit summary for details of patient instuctions

## 2016-02-08 ENCOUNTER — Ambulatory Visit (INDEPENDENT_AMBULATORY_CARE_PROVIDER_SITE_OTHER): Payer: 59 | Admitting: Family Medicine

## 2016-02-08 ENCOUNTER — Encounter: Payer: Self-pay | Admitting: Family Medicine

## 2016-02-08 DIAGNOSIS — R200 Anesthesia of skin: Secondary | ICD-10-CM

## 2016-02-08 DIAGNOSIS — S161XXD Strain of muscle, fascia and tendon at neck level, subsequent encounter: Secondary | ICD-10-CM | POA: Diagnosis not present

## 2016-02-08 NOTE — Progress Notes (Signed)
Subjective  Patient is presenting with the following illnesses   Shoulder Neck pain Feels is completely back to normal.  No pain or weakness or loss of sensation Not taking ibuprofen or gabapentin   Chief Complaint noted Review of Symptoms - see HPI PMH - Smoking status noted.     Objective Vital Signs reviewed Neck and Shoulders - FROM Normal Shoulder, elbow wrist hand strength and sensation of hands     Assessments/Plans  No problem-specific Assessment & Plan notes found for this encounter.   See Encounter view if individual problem A/Ps not visible See after visit summary for details of patient instuctions

## 2016-02-08 NOTE — Assessment & Plan Note (Signed)
Resolved

## 2016-02-08 NOTE — Patient Instructions (Signed)
Good to see you today!  Thanks for coming in.'  Your arm and neck seem completely healed.  You can resume all normal activities.  If it becomes irritated then you can use ibuprofen as needed  Come back in 1 year for a check up

## 2016-02-22 ENCOUNTER — Ambulatory Visit: Payer: 59 | Admitting: Neurology

## 2016-04-12 ENCOUNTER — Ambulatory Visit (INDEPENDENT_AMBULATORY_CARE_PROVIDER_SITE_OTHER): Payer: Self-pay | Admitting: Family Medicine

## 2016-04-12 ENCOUNTER — Encounter: Payer: Self-pay | Admitting: Family Medicine

## 2016-04-12 VITALS — BP 120/80 | HR 77 | Temp 98.7°F | Wt 174.0 lb

## 2016-04-12 DIAGNOSIS — R200 Anesthesia of skin: Secondary | ICD-10-CM

## 2016-04-12 DIAGNOSIS — R202 Paresthesia of skin: Secondary | ICD-10-CM

## 2016-04-12 DIAGNOSIS — M4722 Other spondylosis with radiculopathy, cervical region: Secondary | ICD-10-CM
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
## 2016-04-12 NOTE — Patient Instructions (Signed)
Continue taking the Gabapentin, Motrin as directed.  I have placed a referral to the orthopedic doctor for further evaluation and treatment.

## 2016-04-16 ENCOUNTER — Ambulatory Visit (INDEPENDENT_AMBULATORY_CARE_PROVIDER_SITE_OTHER): Payer: 59 | Admitting: Orthopaedic Surgery

## 2016-04-16 ENCOUNTER — Encounter (INDEPENDENT_AMBULATORY_CARE_PROVIDER_SITE_OTHER): Payer: Self-pay | Admitting: Orthopaedic Surgery

## 2016-04-16 DIAGNOSIS — M5412 Radiculopathy, cervical region: Secondary | ICD-10-CM

## 2016-04-16 DIAGNOSIS — N390 Urinary tract infection, site not specified: Secondary | ICD-10-CM | POA: Diagnosis not present
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
## 2016-04-20 DIAGNOSIS — H16223 Keratoconjunctivitis sicca, not specified as Sjogren's, bilateral: Secondary | ICD-10-CM | POA: Diagnosis not present

## 2016-04-20 DIAGNOSIS — H04123 Dry eye syndrome of bilateral lacrimal glands: Secondary | ICD-10-CM | POA: Diagnosis not present

## 2016-04-20 DIAGNOSIS — H40013 Open angle with borderline findings, low risk, bilateral: Secondary | ICD-10-CM | POA: Diagnosis not present

## 2016-04-23 ENCOUNTER — Encounter (INDEPENDENT_AMBULATORY_CARE_PROVIDER_SITE_OTHER): Payer: Self-pay | Admitting: Physical Medicine and Rehabilitation

## 2016-04-23 ENCOUNTER — Ambulatory Visit (INDEPENDENT_AMBULATORY_CARE_PROVIDER_SITE_OTHER): Payer: 59 | Admitting: Physical Medicine and Rehabilitation

## 2016-04-23 VITALS — BP 149/85 | HR 82

## 2016-04-23 DIAGNOSIS — M5010 Cervical disc disorder with radiculopathy, unspecified cervical region: Secondary | ICD-10-CM

## 2016-04-23 DIAGNOSIS — R202 Paresthesia of skin: Secondary | ICD-10-CM
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
## 2016-04-24 NOTE — Procedures (Signed)
EMG & NCV Findings: Evaluation of the right median motor nerve showed prolonged distal onset latency (5.2 ms) and decreased conduction velocity (Elbow-Wrist, 48 m/s).  The left median (across palm) sensory and the right median (across palm) sensory nerves showed prolonged distal peak latency (Wrist, L4.1, R5.6 ms) and prolonged distal peak latency (Palm, L2.1, R2.1 ms).  All remaining nerves (as indicated in the following tables) were within normal limits.  Left vs. Right side comparison data for the median motor nerve indicates abnormal L-R latency difference (1.7 ms).    All examined muscles (as indicated in the following table) showed no evidence of electrical instability.    Impression: The above electrodiagnostic study is ABNORMAL and reveals evidence of:  1.  A moderate right median nerve entrapment at the wrist (carpal tunnel syndrome) affecting sensory and motor components.   2.  A mild left median nerve entrapment at the wrist (carpal tunnel syndrome) affecting sensory components  There is no significant electrodiagnostic evidence of any other focal nerve entrapment, brachial plexopathy, cervical radiculopathy or generalized peripheral neuropathy.   ** While, as you know, this particular electrodiagnostic study cannot rule out chemical radiculitis or sensory only radiculopathy, I would favor her cervical symptoms as likely myofascial pain.  Recommendations: 1.  Follow-up with referring physician. 2.  Continue current management of symptoms. 3.  Continue use of resting splint at night-time and as needed during the day. 4.  Suggest surgical evaluation on the right carpal tunnel. Diagnostic injection may be of benefit.   Nerve Conduction Studies Anti Sensory Summary Table   Stim Site NR Peak (ms) Norm Peak (ms) P-T Amp (V) Norm P-T Amp Site1 Site2 Delta-P (ms) Dist (cm) Vel (m/s) Norm Vel (m/s)  Left Median Acr Palm Anti Sensory (2nd Digit)  31.4C  Wrist    *4.1 <3.6 40.9 >10  Wrist Palm 2.0 0.0    Palm    *2.1 <2.0 39.2         Right Median Acr Palm Anti Sensory (2nd Digit)  31.5C  Wrist    *5.6 <3.6 23.9 >10 Wrist Palm 3.5 0.0    Palm    *2.1 <2.0 21.5         Right Radial Anti Sensory (Base 1st Digit)  31.9C  Wrist    1.9 <3.1 22.7  Wrist Base 1st Digit 1.9 0.0    Right Ulnar Anti Sensory (5th Digit)  31.6C  Wrist    3.4 <3.7 41.3 >15.0 Wrist 5th Digit 3.4 14.0 41 >38   Motor Summary Table   Stim Site NR Onset (ms) Norm Onset (ms) O-P Amp (mV) Norm O-P Amp Site1 Site2 Delta-0 (ms) Dist (cm) Vel (m/s) Norm Vel (m/s)  Left Median Motor (Abd Poll Brev)  31.5C  Wrist    3.5 <4.2 11.6 >5 Elbow Wrist 4.0 21.5 54 >50  Elbow    7.5  7.4         Right Median Motor (Abd Poll Brev)  31.6C  Wrist    *5.2 <4.2 9.9 >5 Elbow Wrist 4.6 22.3 *48 >50  Elbow    9.8  3.2         Right Ulnar Motor (Abd Dig Min)  31.2C  Wrist    3.1 <4.2 13.0 >3 B Elbow Wrist 3.3 22.0 67 >53  B Elbow    6.4  12.6  A Elbow B Elbow 1.3 12.0 92 >53  A Elbow    7.7  12.6  EMG   Side Muscle Nerve Root Ins Act Fibs Psw Amp Dur Poly Recrt Int Fraser Din Comment  Right Abd Poll Brev Median C8-T1 Nml Nml Nml Nml Nml 0 Nml Nml   Right 1stDorInt Ulnar C8-T1 Nml Nml Nml Nml Nml 0 Nml Nml   Right PronatorTeres Median C6-7 Nml Nml Nml Nml Nml 0 Nml Nml   Right Biceps Musculocut C5-6 Nml Nml Nml Nml Nml 0 Nml Nml   Right Deltoid Axillary C5-6 Nml Nml Nml Nml Nml 0 Nml Nml     Nerve Conduction Studies Anti Sensory Left/Right Comparison   Stim Site L Lat (ms) R Lat (ms) L-R Lat (ms) L Amp (V) R Amp (V) L-R Amp (%) Site1 Site2 L Vel (m/s) R Vel (m/s) L-R Vel (m/s)  Median Acr Palm Anti Sensory (2nd Digit)  31.4C  Wrist *4.1 *5.6 1.5 40.9 23.9 41.6 Wrist Palm     Palm *2.1 *2.1 0.0 39.2 21.5 45.2       Radial Anti Sensory (Base 1st Digit)  31.9C  Wrist  1.9   22.7  Wrist Base 1st Digit     Ulnar Anti Sensory (5th Digit)  31.6C  Wrist  3.4   41.3  Wrist 5th Digit  41    Motor Left/Right  Comparison   Stim Site L Lat (ms) R Lat (ms) L-R Lat (ms) L Amp (mV) R Amp (mV) L-R Amp (%) Site1 Site2 L Vel (m/s) R Vel (m/s) L-R Vel (m/s)  Median Motor (Abd Poll Brev)  31.5C  Wrist 3.5 *5.2 *1.7 11.6 9.9 14.7 Elbow Wrist 54 *48 6  Elbow 7.5 9.8 2.3 7.4 3.2 56.8       Ulnar Motor (Abd Dig Min)  31.2C  Wrist  3.1   13.0  B Elbow Wrist  67   B Elbow  6.4   12.6  A Elbow B Elbow  92   A Elbow  7.7   12.6

## 2016-05-01 ENCOUNTER — Encounter (INDEPENDENT_AMBULATORY_CARE_PROVIDER_SITE_OTHER): Payer: Self-pay | Admitting: Orthopaedic Surgery

## 2016-05-01 ENCOUNTER — Ambulatory Visit (INDEPENDENT_AMBULATORY_CARE_PROVIDER_SITE_OTHER): Payer: 59 | Admitting: Orthopaedic Surgery

## 2016-05-01 DIAGNOSIS — G5602 Carpal tunnel syndrome, left upper limb: Secondary | ICD-10-CM | POA: Insufficient documentation

## 2016-05-01 DIAGNOSIS — M5412 Radiculopathy, cervical region: Secondary | ICD-10-CM

## 2016-05-01 DIAGNOSIS — G5601 Carpal tunnel syndrome, right upper limb: Secondary | ICD-10-CM | POA: Insufficient documentation

## 2016-05-01 MED ORDER — METHYLPREDNISOLONE ACETATE 40 MG/ML IJ SUSP
20.0000 mg | INTRAMUSCULAR | Status: AC | PRN
  Administered 2016-05-01: 20 mg

## 2016-05-01 MED ORDER — LIDOCAINE HCL 1 % IJ SOLN
0.5000 mL | INTRAMUSCULAR | Status: AC | PRN
  Administered 2016-05-01: .5 mL

## 2016-05-01 MED ORDER — BUPIVACAINE HCL 0.5 % IJ SOLN
0.5000 mL | INTRAMUSCULAR | Status: AC | PRN
  Administered 2016-05-01: .5 mL

## 2016-05-01 NOTE — Progress Notes (Signed)
Office Visit Note   Patient: Sheri Shepard           Date of Birth: 03-10-1965           MRN: [id number] Visit Date: 05/01/2016              Requested by: Lind Covert, MD [address] [place], [place] [address] PCP: Lind Covert, MD   Assessment & Plan: Visit Diagnoses:  1. Carpal tunnel syndrome on right   2. Carpal tunnel syndrome on left   3. Radiculopathy, cervical region     Plan: Right carpal tunnel was injected today I'll like to see her back in 4 weeks to see her response to this. If not better than the next up would be to obtain an MRI of her cervical spine  Follow-Up Instructions: Return in about 4 weeks (around 05/29/2016).   Orders:  No orders of the defined types were placed in this encounter.  No orders of the defined types were placed in this encounter.     Procedures: Hand/UE Inj Date/Time: 05/01/2016 9:34 AM Performed by: Leandrew Koyanagi Authorized by: Leandrew Koyanagi   Consent Given by:  Patient Timeout: prior to procedure the correct patient, procedure, and site was verified   Indications:  Pain Condition: carpal tunnel   Site:  R carpal tunnel Prep: patient was prepped and draped in usual sterile fashion   Needle Size:  25 G Approach:  Volar Medications:  0.5 mL lidocaine 1 %; 0.5 mL bupivacaine 0.5 %; 20 mg methylPREDNISolone acetate 40 MG/ML     Clinical Data: No additional findings.   Subjective: Chief Complaint  Patient presents with  . Right Wrist - Pain  . Left Wrist - Pain  . Neck - Pain    Patient comes back to review her nerve conduction study. Her right hand is bothering him more. The nerve conduction study showed a moderate carpal tunnel syndrome on the right and mild on the left.    Review of Systems   Objective: Vital Signs: LMP 12/14/2011   Physical Exam  Ortho Exam Exam is unchanged. Specialty Comments:  No specialty comments available.  Imaging: No results found.   PMFS  History: Patient Active Problem List   Diagnosis Date Noted  . Carpal tunnel syndrome on right 05/01/2016  . Carpal tunnel syndrome on left 05/01/2016  . Radiculopathy, cervical region 05/01/2016  . Weight gain 07/28/2014  . Nevus 02/13/2012  . Sickle cell trait ([place])    Past Medical History:  Diagnosis Date  . Allergy   . Bladder infection   . Fibroids 2012  . History of blood transfusion 1991   unsure of [facility]  . Seasonal allergies   . 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 Sickle cell trait ([facility])   . Yeast infection     Family History  Problem Relation Age of Onset  . Kidney failure Mother   . Hypertension Mother   . Stroke Mother   . Anemia Mother   . Diabetes Sister   . Diabetes Brother     Past Surgical History:  Procedure Laterality Date  . ABDOMINAL HYSTERECTOMY    . CESAREAN SECTION  [phone]   x 3  . EYE MUSCLE SURGERY    . ROBOTIC ASSISTED TOTAL HYSTERECTOMY Bilateral 07/10/2012   Procedure: ROBOTIC ASSISTED TOTAL HYSTERECTOMY WITH BILATERAL SALPINGECTOMY;  Surgeon: Alwyn Pea, MD;  Location: [facility];  Service: Gynecology;  Laterality: Bilateral;  . TONSILLECTOMY AND ADENOIDECTOMY    . TUBAL LIGATION    . WISDOM TOOTH EXTRACTION     Social History   Occupational History  . Not on file.   Social History Main Topics  . Smoking status: Former Smoker    Types: Cigars    Quit date: 02/26/2010  . Smokeless tobacco: Never Used  . Alcohol use 1.8 oz/week    2 Standard drinks or equivalent, 1 Glasses of wine per week     Comment: weekends  . Drug use: No  . Sexual activity: Yes    Birth control/ protection: Surgical     Comment: BTL

## 2016-05-10 ENCOUNTER — Telehealth (INDEPENDENT_AMBULATORY_CARE_PROVIDER_SITE_OTHER): Payer: Self-pay

## 2016-05-10 NOTE — Telephone Encounter (Signed)
Please advise. Thank you

## 2016-05-10 NOTE — Telephone Encounter (Signed)
Called pt to advise

## 2016-05-10 NOTE — Telephone Encounter (Signed)
Not sure why she's itching.  That's not a side effect of the injection.  She's welcome to come in to have it checked out if it persists.

## 2016-05-10 NOTE — Telephone Encounter (Signed)
Pt called and states that she had a cortisone injection in her wrist about a week ago and since it has been extremely itchy and she states that she is very concerned about this. Patient states that she has not taken any medication or applied any ointments fearful that she would cause harm to the site. She wants the assistant to please call back and advise.

## 2016-06-04 ENCOUNTER — Ambulatory Visit (INDEPENDENT_AMBULATORY_CARE_PROVIDER_SITE_OTHER): Payer: 59 | Admitting: Orthopaedic Surgery

## 2016-06-04 DIAGNOSIS — G5601 Carpal tunnel syndrome, right upper limb: Secondary | ICD-10-CM

## 2016-06-04 NOTE — Progress Notes (Signed)
Office Visit Note   Patient: Sheri Shepard           Date of Birth: 1965/08/03           MRN: [id number] Visit Date: 06/04/2016              Requested by: Lind Covert, MD [address] [place], [place] [address] PCP: Lind Covert, MD   Assessment & Plan: Visit Diagnoses:  1. Carpal tunnel syndrome on right     Plan: At this point I want her to wear a carpal tunnel brace at night only. She's had a good response to the injection. If this flares up again we may need to consider nerve conduction studies. Certainly no indication for advanced imaging at this point. Follow-up with me as needed. Questions encouraged and answered.  Follow-Up Instructions: Return if symptoms worsen or fail to improve.   Orders:  No orders of the defined types were placed in this encounter.  No orders of the defined types were placed in this encounter.     Procedures: No procedures performed   Clinical Data: No additional findings.   Subjective: No chief complaint on file.   Patient comes back today status post carpal tunnel injection on the right side. She is doing very well. She states she is lying 98% better she just has a little bit of itching where the injection went in. She no longer has any neck pain or hand pain.    Review of Systems   Objective: Vital Signs: LMP 12/14/2011   Physical Exam  Ortho Exam Right hand and wrist exam is benign. Specialty Comments:  No specialty comments available.  Imaging: No results found.   PMFS History: Patient Active Problem List   Diagnosis Date Noted  . Carpal tunnel syndrome on right 05/01/2016  . Carpal tunnel syndrome on left 05/01/2016  . Radiculopathy, cervical region 05/01/2016  . Weight gain 07/28/2014  . Nevus 02/13/2012  . Sickle cell trait ([facility])    Past Medical History:  Diagnosis Date  . Allergy   . Bladder infection   . Fibroids 2012  . History of blood transfusion 1991   unsure of  [facility]  . Seasonal allergies   . 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 Sickle cell trait ([place])   . Yeast infection     Family History  Problem Relation Age of Onset  . Kidney failure Mother   . Hypertension Mother   . Stroke Mother   . Anemia Mother   . Diabetes Sister   . Diabetes Brother     Past Surgical History:  Procedure Laterality Date  . ABDOMINAL HYSTERECTOMY    . CESAREAN SECTION  [phone]   x 3  . EYE MUSCLE SURGERY    . ROBOTIC ASSISTED TOTAL HYSTERECTOMY Bilateral 07/10/2012   Procedure: ROBOTIC ASSISTED TOTAL HYSTERECTOMY WITH BILATERAL SALPINGECTOMY;  Surgeon: Alwyn Pea, MD;  Location: [facility];  Service: Gynecology;  Laterality: Bilateral;  . TONSILLECTOMY AND ADENOIDECTOMY    . TUBAL LIGATION    . WISDOM TOOTH EXTRACTION     Social History   Occupational History  . Not on file.   Social History Main Topics  . Smoking status: Former Smoker    Types: Cigars    Quit date: 02/26/2010  . Smokeless tobacco: Never Used  . Alcohol use 1.8 oz/week    2 Standard drinks or equivalent, 1 Glasses of  wine per week     Comment: weekends  . Drug use: No  . Sexual activity: Yes    Birth control/ protection: Surgical     Comment: BTL

## 2016-07-04 ENCOUNTER — Encounter: Payer: 59 | Admitting: Family Medicine

## 2016-07-18 ENCOUNTER — Ambulatory Visit (INDEPENDENT_AMBULATORY_CARE_PROVIDER_SITE_OTHER): Payer: 59 | Admitting: Family Medicine

## 2016-07-18 ENCOUNTER — Encounter: Payer: Self-pay | Admitting: Family Medicine

## 2016-07-18 VITALS — BP 140/82 | HR 81 | Temp 98.5°F | Ht 63.0 in | Wt 179.4 lb

## 2016-07-18 DIAGNOSIS — Z0000 Encounter for general adult medical examination without abnormal findings: Secondary | ICD-10-CM | POA: Diagnosis not present

## 2016-07-18 NOTE — Patient Instructions (Addendum)
Good to see you today!  Thanks for coming in.  Eat more veggies - string bean broccoli   Bring small lunch snack  Exercise -  Walking - Consider a step monitor on your phone  AWOL - exercise  Five minute rule - start exercise only for 5 minutes  In One Month You will feel less tired and  Aim to lose 1 lb a week = 4-5 lbs in a month = 175   Come in one year

## 2016-07-18 NOTE — Progress Notes (Signed)
Subjective  Patient is presenting for a physical  Feels well no concerns  Patient reports no  vision/ hearing changes,anorexia, weight change, fever ,adenopathy, persistant / recurrent hoarseness, swallowing issues, chest pain, edema,persistant / recurrent cough, hemoptysis, dyspnea(rest, exertional, paroxysmal nocturnal), gastrointestinal  bleeding (melena, rectal bleeding), abdominal pain, excessive heart burn, GU symptoms(dysuria, hematuria, pyuria, voiding/incontinence  Issues) syncope, focal weakness, severe memory loss, concerning skin lesions, depression, anxiety, abnormal bruising/bleeding, major joint swelling, breast masses     Chief Complaint noted Review of Symptoms - see HPI PMH - Smoking status noted.     Objective Vital Signs reviewed  Ears:  External ear exam shows no significant lesions or deformities.  Otoscopic examination reveals clear canals, tympanic membranes are intact bilaterally without bulging, retraction, inflammation or discharge. Hearing is grossly normal bilaterall Neck:  No deformities, thyromegaly, masses, or tenderness noted.   Supple with full range of motion without pain. Mouth - no lesions, mucous membranes are moist, no decaying teeth   Heart - Regular rate and rhythm.  No murmurs, gallops or rubs.    Lungs:  Normal respiratory effort, chest expands symmetrically. Lungs are clear to auscultation, no crackles or wheezes. Abdomen: soft and non-tender without masses, organomegaly or hernias noted.  No guarding or rebound Extremities:  No cyanosis, edema, or deformity noted with good range of motion of all major joints.  , Skin:  Intact without suspicious lesions or rashes A sebk on R arm and left chest - well circumscribed     Assessments/Plans  Normal exam.  See after visit summary for suggestions   No problem-specific Assessment & Plan notes found for this encounter.   See Encounter view if individual problem A/Ps not visible See after visit  summary for details of patient instuctions

## 2016-08-07 DIAGNOSIS — Z01419 Encounter for gynecological examination (general) (routine) without abnormal findings: Secondary | ICD-10-CM | POA: Diagnosis not present

## 2016-08-14 ENCOUNTER — Telehealth: Payer: Self-pay | Admitting: Family Medicine

## 2016-08-14 NOTE — Telephone Encounter (Signed)
Diet: Have you been eating more vegetables? Some what, she's eating a little better She has been bringing lunch snacks and has been eating lighter portions.  Exercise: Pt walks about an hour every other day  - Mesha Guinyard

## 2017-03-26 DIAGNOSIS — H04123 Dry eye syndrome of bilateral lacrimal glands: Secondary | ICD-10-CM | POA: Diagnosis not present

## 2017-03-26 DIAGNOSIS — H16223 Keratoconjunctivitis sicca, not specified as Sjogren's, bilateral: Secondary | ICD-10-CM | POA: Diagnosis not present

## 2017-03-26 DIAGNOSIS — H40013 Open angle with borderline findings, low risk, bilateral: Secondary | ICD-10-CM | POA: Diagnosis not present

## 2017-06-13 DIAGNOSIS — Z1159 Encounter for screening for other viral diseases: Secondary | ICD-10-CM | POA: Diagnosis not present

## 2017-06-13 DIAGNOSIS — R300 Dysuria: Secondary | ICD-10-CM | POA: Diagnosis not present

## 2017-06-13 DIAGNOSIS — N390 Urinary tract infection, site not specified: Secondary | ICD-10-CM | POA: Diagnosis not present

## 2017-06-13 DIAGNOSIS — N760 Acute vaginitis: Secondary | ICD-10-CM | POA: Diagnosis not present

## 2017-07-04 IMAGING — MR MR CERVICAL SPINE W/O CM
4 of 5 series · 19 of 48 positions shown · non-contrast
Comparison: Radiographs 06/03/2015

CLINICAL DATA: 49-year-old with bilateral hand numbness and arm
pain for 2 months. No acute injury or prior relevant surgery.
Evaluate for disc herniation.

EXAM:
MRI CERVICAL SPINE WITHOUT CONTRAST
TECHNIQUE: Multiplanar, multisequence MR imaging of the cervical spine was
performed. No intravenous contrast was administered.

[Series 2: T2 · sagittal · 3.0mm · 0.41mm/px · 6 of 12 slices shown (1 of 2)]
[im 1/12]
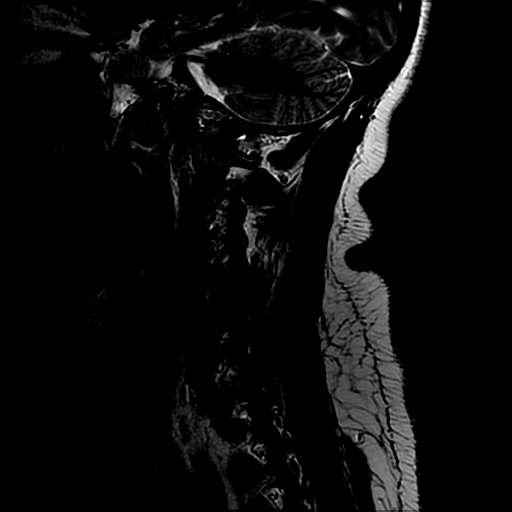
[im 3/12]
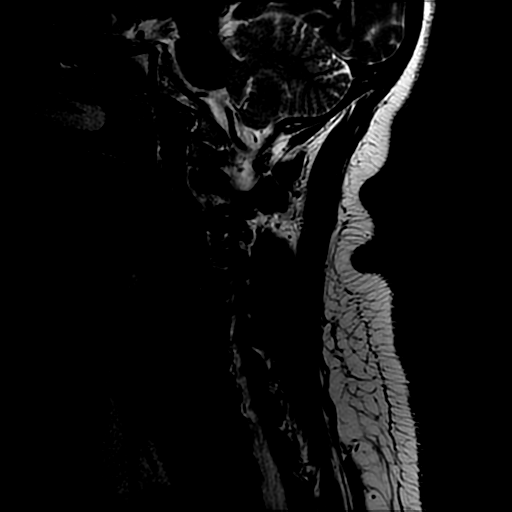
[im 5/12]
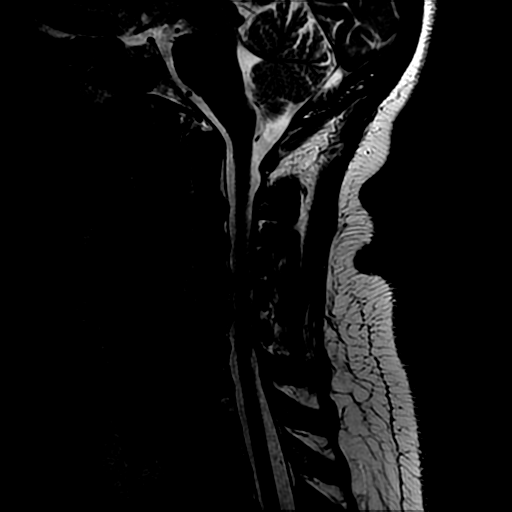
[im 7/12]
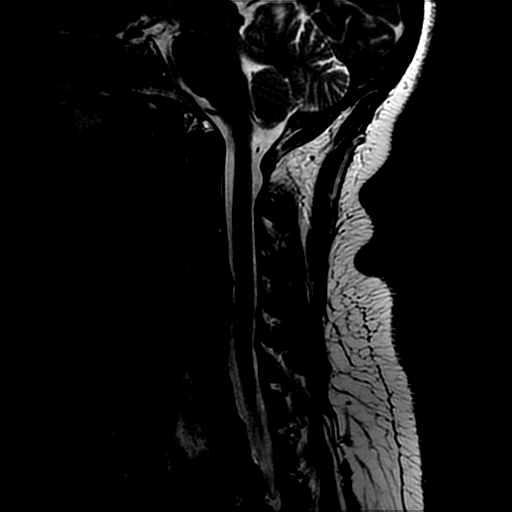
[im 9/12]
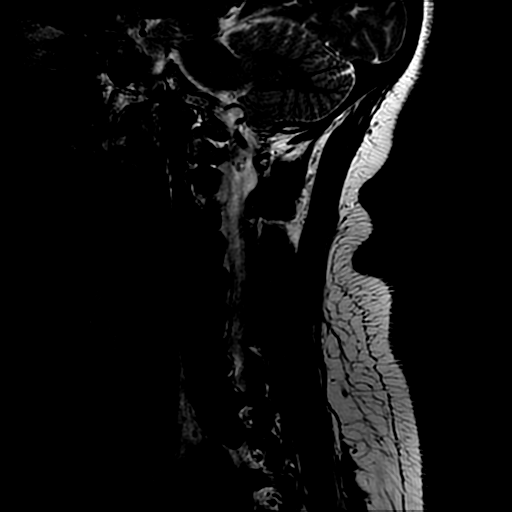
[im 12/12]
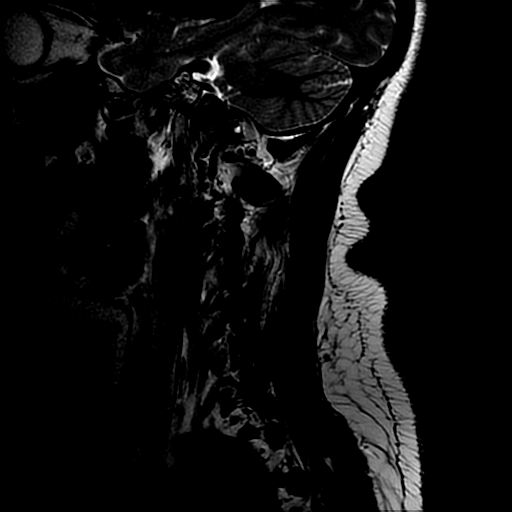

[Series 3: T1 · sagittal · 3.0mm · 0.41mm/px · 3 of 12 slices shown]
[im 3/12]
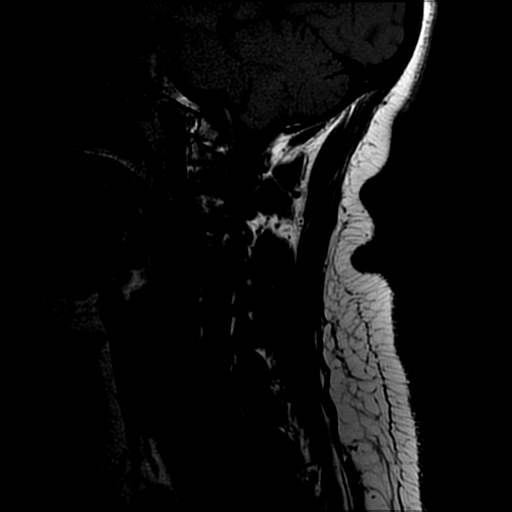
[im 7/12]
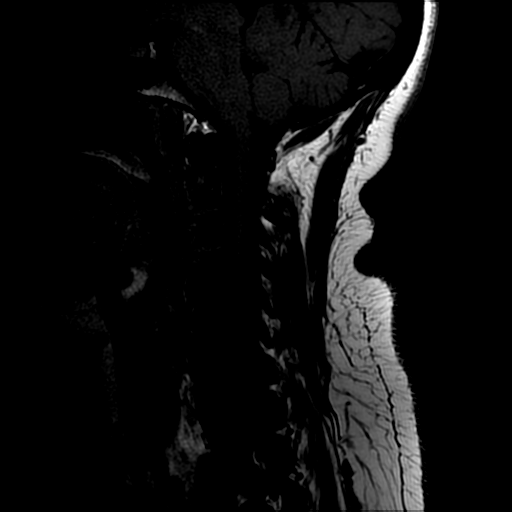
[im 12/12]
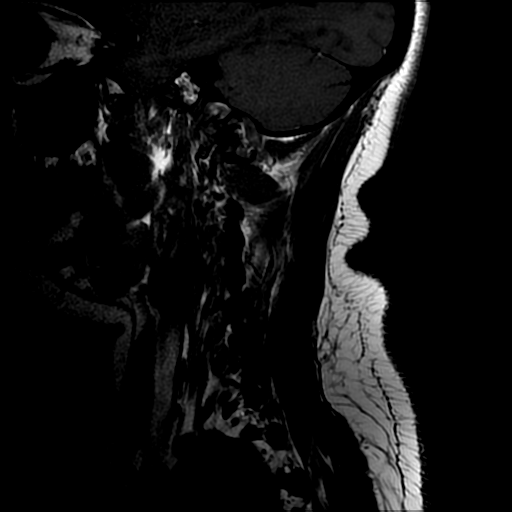

[Series 4: sag ir · sagittal · 3.0mm · 0.41mm/px · 3 of 12 slices shown]
[im 3/12]
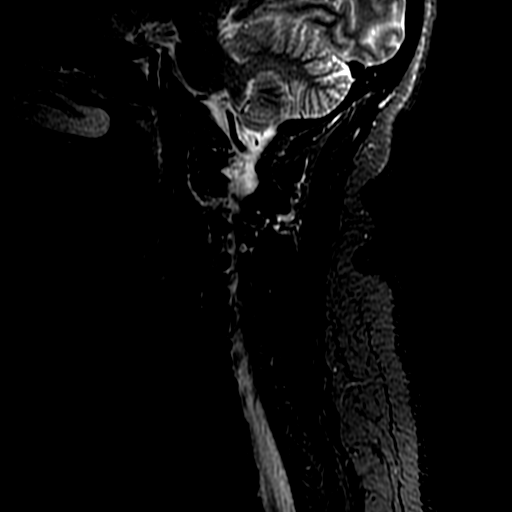
[im 7/12]
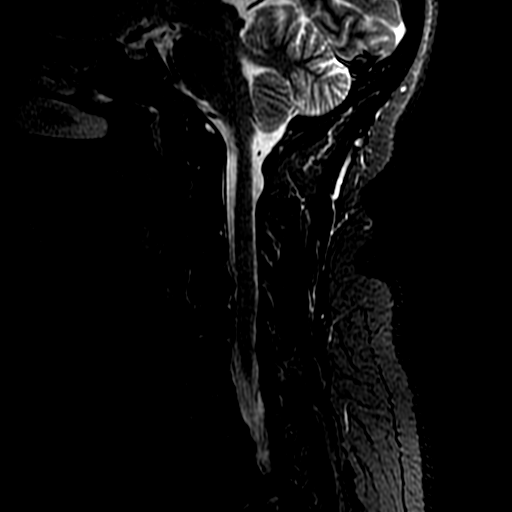
[im 12/12]
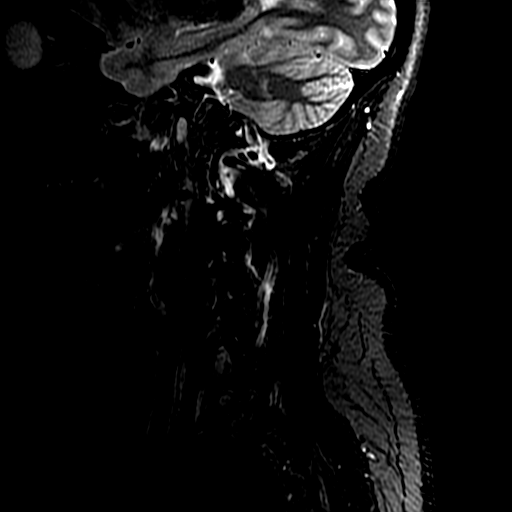

[Series 6: T2 · axial · 3.1mm · 0.35mm/px · z∈[-74,+11]mm · 7 of 29 slices shown (2 of 2)]
[im 1/29]
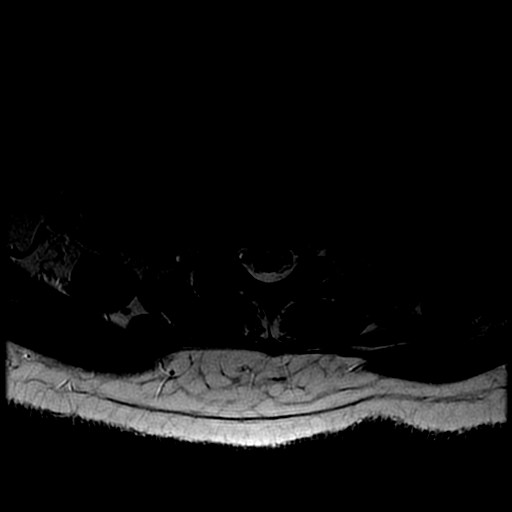
[im 5/29]
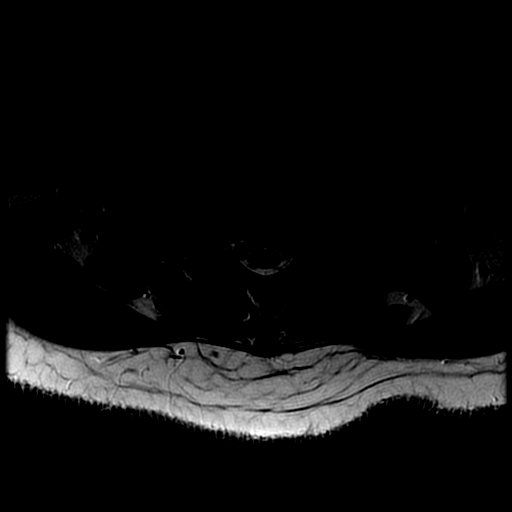
[im 9/29]
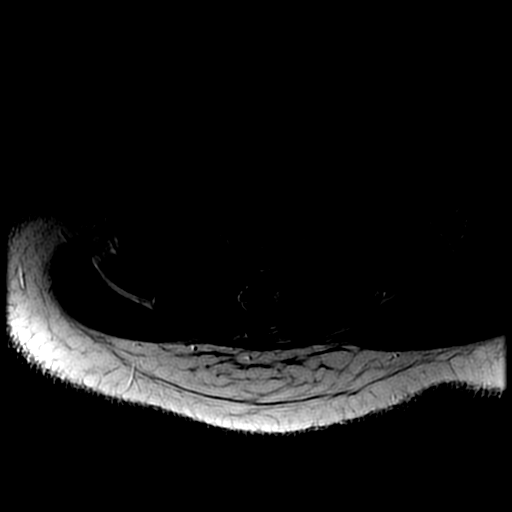
[im 13/29]
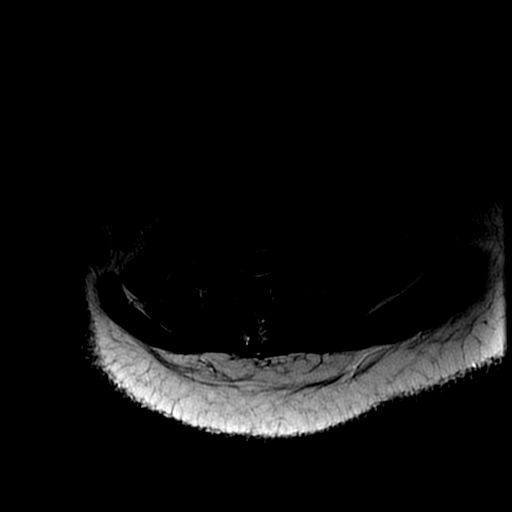
[im 15/29]
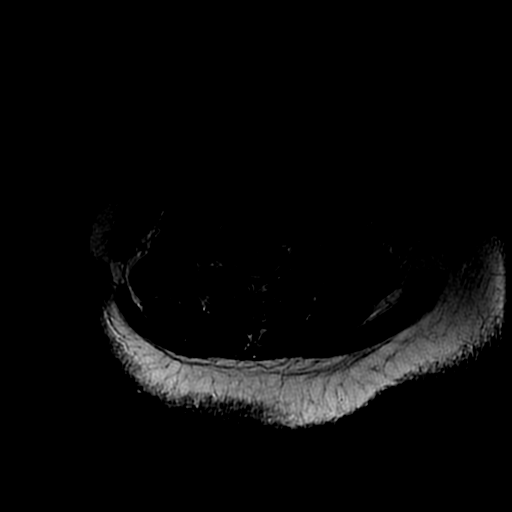
[im 17/29]
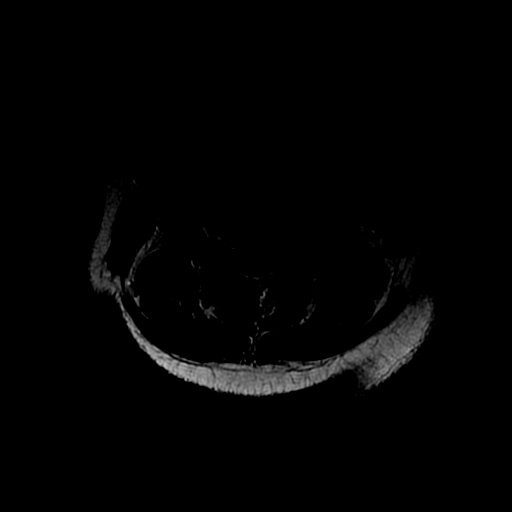
[im 25/29]
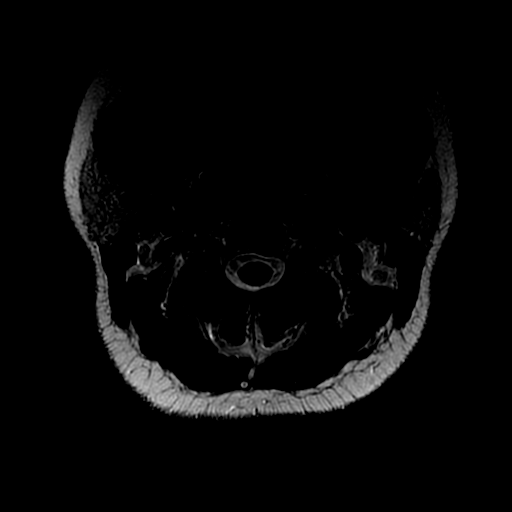

[19 of 48 positions shown; findings below may reference images not displayed]

FINDINGS: Alignment: Stable and near anatomic.

Vertebrae: No acute or suspicious osseous findings.

Cord: Normal in signal and caliber.

Posterior Fossa, vertebral arteries, paraspinal tissues: Visualized
portions of the posterior fossa and paraspinal soft tissues appear
unremarkable. Bilateral vertebral artery flow voids.

Disc levels:

No significant disc space findings at or above C3-4.

C4-5: Mild disc bulging. No cord deformity or significant foraminal
compromise.

C5-6: Mild disc bulging with small disc protrusions centrally and in
the medial aspect of the left foramen. No cord deformity. The left
foramen is mildly narrowed with potential left C6 nerve root
encroachment.

C6-7:  Normal interspace.

C7-T1:  Normal interspace.
IMPRESSION: 1. Mild spondylosis at C4-5 and C5-6 as described. There are small
central and left-sided disc protrusions at C5-6 resulting in mild
left foraminal narrowing and potential left C6 nerve root
encroachment.
2. No cord deformity or other significant findings.

## 2017-08-22 ENCOUNTER — Ambulatory Visit: Payer: 59 | Admitting: Internal Medicine

## 2017-09-04 ENCOUNTER — Encounter: Payer: Self-pay | Admitting: Family Medicine

## 2017-09-04 ENCOUNTER — Ambulatory Visit (INDEPENDENT_AMBULATORY_CARE_PROVIDER_SITE_OTHER): Payer: 59 | Admitting: Family Medicine

## 2017-09-04 ENCOUNTER — Other Ambulatory Visit: Payer: Self-pay

## 2017-09-04 VITALS — BP 136/90 | HR 85 | Temp 98.3°F | Ht 63.0 in | Wt 190.8 lb

## 2017-09-04 DIAGNOSIS — R609 Edema, unspecified: Secondary | ICD-10-CM | POA: Diagnosis not present

## 2017-09-04 DIAGNOSIS — R635 Abnormal weight gain: Secondary | ICD-10-CM

## 2017-09-04 DIAGNOSIS — M549 Dorsalgia, unspecified: Secondary | ICD-10-CM | POA: Diagnosis not present

## 2017-09-04 LAB — POCT URINALYSIS DIP (MANUAL ENTRY)
BILIRUBIN UA: NEGATIVE
GLUCOSE UA: NEGATIVE mg/dL
Ketones, POC UA: NEGATIVE mg/dL
LEUKOCYTES UA: NEGATIVE
NITRITE UA: NEGATIVE
PH UA: 6 (ref 5.0–8.0)
Protein Ur, POC: 30 mg/dL — AB
Spec Grav, UA: 1.02 (ref 1.010–1.025)
Urobilinogen, UA: 0.2 E.U./dL

## 2017-09-04 LAB — POCT UA - MICROSCOPIC ONLY

## 2017-09-04 NOTE — Assessment & Plan Note (Signed)
Most consistent with edema due to weight gain and increased salt intake.  No systemic symptoms of heart or liver disease.  Check labs. Encourage diet change and exercise

## 2017-09-04 NOTE — Progress Notes (Signed)
Subjective  Sheri Shepard is a 52 y.o. female is presenting for a physical exam  She feels well except for   PEDAL EDEMA For the last few weeks to months.  Worse at the end of the day.  Better with elevation.  No pain or lesions.  No shortness of breath or orthopnea or DOE.  She has been eating out a lot more lately due to long hours at work -    Patient reports no  vision/ hearing changes,anorexia, fever ,adenopathy, persistant / recurrent hoarseness, swallowing issues, chest pain, edema,persistant / recurrent cough, hemoptysis, dyspnea(rest, exertional, paroxysmal nocturnal), gastrointestinal  bleeding (melena, rectal bleeding), abdominal pain, excessive heart burn, GU symptoms(dysuria, hematuria, pyuria, voiding/incontinence  Issues) syncope, focal weakness, severe memory loss, concerning skin lesions, depression, anxiety, abnormal bruising/bleeding, major joint swelling, breast masses or abnormal vaginal bleeding.    Chief Complaint noted Review of Symptoms - see HPI PMH - Smoking status noted.    Objective Vital Signs reviewed BP 136/90   Pulse 85   Temp 98.3 F (36.8 C) (Oral)   Ht 5\' 3"  (1.6 m)   Wt 190 lb 12.8 oz (86.5 kg)   LMP 12/14/2011   SpO2 98%   BMI 33.80 kg/m  Heart - Regular rate and rhythm.  No murmurs, gallops or rubs.    Lungs:  Normal respiratory effort, chest expands symmetrically. Lungs are clear to auscultation, no crackles or wheezes. Abdomen: soft and non-tender without masses, organomegaly or hernias noted.  No guarding or rebound Extremities: Mild bilateral edema slightly pitting no deformity noted with good range of motion of all major joints.    Assessments/Plans  See after visit summary for details of patient instuctions  Edema Most consistent with edema due to weight gain and increased salt intake.  No systemic symptoms of heart or liver disease.  Check labs. Encourage diet change and exercise    Weight gain Worsened.  Partially due to  excessive hours at work.  Discussed approaches - she feels she can deal with things on her own

## 2017-09-04 NOTE — Patient Instructions (Addendum)
Good to see you today!  Thanks for coming in.  I think the edema is mainly your weight but we will check labs. I will call you if your tests are not good.  Otherwise I will send you a letter.  If you do not hear from me with in 2 weeks please call our office.     If the edema is worsening or if you have chest pain or shortness of breath then come right back   Try the 5 minute rule of even if very tired walk for 5 minutes at the end the day and you will likely feel like doing more and mentally better  Ask Dr Matthew Saras to send [place] the results of your pap  Listen to yourself

## 2017-09-04 NOTE — Assessment & Plan Note (Signed)
Worsened.  Partially due to excessive hours at work.  Discussed approaches - she feels she can deal with things on her own

## 2017-09-05 ENCOUNTER — Encounter: Payer: Self-pay | Admitting: Family Medicine

## 2017-09-05 LAB — CMP14+EGFR
ALK PHOS: 85 IU/L (ref 39–117)
ALT: 13 IU/L (ref 0–32)
AST: 15 IU/L (ref 0–40)
Albumin/Globulin Ratio: 1.4 (ref 1.2–2.2)
Albumin: 4.1 g/dL (ref 3.5–5.5)
BILIRUBIN TOTAL: 0.3 mg/dL (ref 0.0–1.2)
BUN / CREAT RATIO: 10 (ref 9–23)
BUN: 8 mg/dL (ref 6–24)
CHLORIDE: 104 mmol/L (ref 96–106)
CO2: 22 mmol/L (ref 20–29)
Calcium: 9.5 mg/dL (ref 8.7–10.2)
Creatinine, Ser: 0.83 mg/dL (ref 0.57–1.00)
GFR calc Af Amer: 96 mL/min/{1.73_m2} (ref >59)
GFR calc non Af Amer: 83 mL/min/{1.73_m2} (ref >59)
Globulin, Total: 3 g/dL (ref 1.5–4.5)
Glucose: 107 mg/dL — ABNORMAL HIGH (ref 65–99)
POTASSIUM: 4.7 mmol/L (ref 3.5–5.2)
Sodium: 140 mmol/L (ref 134–144)
Total Protein: 7.1 g/dL (ref 6.0–8.5)

## 2017-09-05 LAB — CBC
HEMATOCRIT: 41.4 % (ref 34.0–46.6)
Hemoglobin: 13.3 g/dL (ref 11.1–15.9)
MCH: 28.7 pg (ref 26.6–33.0)
MCHC: 32.1 g/dL (ref 31.5–35.7)
MCV: 89 fL (ref 79–97)
Platelets: 398 10*3/uL (ref 150–450)
RBC: 4.63 x10E6/uL (ref 3.77–5.28)
RDW: 13.5 % (ref 12.3–15.4)
WBC: 4.9 10*3/uL (ref 3.4–10.8)

## 2017-10-03 DIAGNOSIS — Z01419 Encounter for gynecological examination (general) (routine) without abnormal findings: Secondary | ICD-10-CM | POA: Diagnosis not present

## 2017-10-04 ENCOUNTER — Encounter: Payer: Self-pay | Admitting: Family Medicine

## 2018-05-29 ENCOUNTER — Telehealth (INDEPENDENT_AMBULATORY_CARE_PROVIDER_SITE_OTHER): Payer: 59 | Admitting: Family Medicine

## 2018-05-29 DIAGNOSIS — J329 Chronic sinusitis, unspecified: Secondary | ICD-10-CM

## 2018-05-29 MED ORDER — FLUTICASONE PROPIONATE 50 MCG/ACT NA SUSP: 2.0000 | Freq: Every day | NASAL | 6 refills | Status: DC

## 2018-05-29 MED ORDER — DOXYCYCLINE HYCLATE 100 MG PO TABS: 100.0000 mg | ORAL_TABLET | Freq: Two times a day (BID) | ORAL | 0 refills | Status: DC

## 2018-05-29 NOTE — Assessment & Plan Note (Signed)
Allergic and now likely infection 2nd to obstruction.  Should isololate due to rare possibility of covid.

## 2018-05-29 NOTE — Progress Notes (Signed)
Sinus infection Agrees to a phone visit Pain between eyes and nose.  Slight running nose.  Dry cough.  Headache.  Scratchy throat  Some chest tiightness. No fever.  No shortness of breath.    Has a history of sinus problems every spring.   Allergic to PCN Normally takes z pac but they are in short supply because of covid.  Will Rx with doxy.  And flonase. Work note:  Hx strongly suggests allergies and sinusitis but cannot rule out mild covid.  Self isolate until sx resolve. Duration of visit 10 minutes.

## 2018-06-02 ENCOUNTER — Telehealth: Payer: Self-pay | Admitting: Family Medicine

## 2018-06-02 NOTE — Telephone Encounter (Signed)
Addendum: recommended conservative measures such as increasing fluid intake and tylenol PRN. Strict return precautions given.   Dalphine Handing, PGY-2 [facility] 06/02/2018 5:32 AM

## 2018-06-02 NOTE — Telephone Encounter (Signed)
**  After Hours/ Emergency Line Call**  Received a call to report that Sheri Shepard continued fevers.  Patient had telemedicine visit on Thursday for sinusitis and was prescribed doxycycline as well as flonase. Symptoms improved x1 day. No symptoms come and go. Patient also has subjective fevers. Also reports muscle aches especially in lower extremities. Symptoms have progressively worsened x1 week. Patient reports since starting doxycycline her cough has improved but still has fevers. Denies SOB. CP has improved with doxycycline use (2/2 to cough). Off and on has a scratchy throat. Sick contacts include son who had similar symptoms around the same time. Has been out of work 2/2 sinusitis. Keeping fluids up but decreased appetite.  Red flags discussed.  Will forward to PCP.   Caroline More, DO PGY-2, [facility] 06/02/2018 5:22 AM

## 2018-06-04 ENCOUNTER — Telehealth: Payer: Self-pay

## 2018-06-04 NOTE — Telephone Encounter (Signed)
Letter generated and placed in the mail per patients request.

## 2018-06-04 NOTE — Telephone Encounter (Signed)
Please print note for her to be out of work until 4/13 and Let her know if she still has symptoms by then to call us  Thanks  [place]

## 2018-06-04 NOTE — Telephone Encounter (Signed)
Pt called nurse line stating she had a telemed visit with [facility] on 4/2, and was written out of work until 4/6. Pt stated her symptoms have not improved, still has a fever, even though does not have a thermometer. Pt also called [facility] for an after hours call. Pt is requesting a new note to reflect returning to work on 4/13. Please advise.

## 2018-06-10 ENCOUNTER — Telehealth: Payer: Self-pay | Admitting: Family Medicine

## 2018-06-10 MED ORDER — BENZONATATE 100 MG PO CAPS: 100.0000 mg | ORAL_CAPSULE | Freq: Two times a day (BID) | ORAL | 0 refills | Status: DC | PRN

## 2018-06-10 NOTE — Telephone Encounter (Signed)
Overall some better but still very frequent cough No fever or shortness of breath   Will send in [place] and extend work restrictions See letter

## 2018-06-16 NOTE — Telephone Encounter (Signed)
Pt calling nurse line stating although she has kicked the fever she believes, she still doesn't feel great. Pt still endorses cough, no taste or smell, and still having diarrhea. Pt is requesting to extend her note a few more days. Pt stated she wanted to go back either Wednesday or Thursday of this week. This needs to be mailed to her.

## 2018-06-17 ENCOUNTER — Encounter: Payer: Self-pay | Admitting: Family Medicine

## 2018-06-17 NOTE — Telephone Encounter (Signed)
Letter written Routed to [facility] team

## 2018-06-20 ENCOUNTER — Telehealth (INDEPENDENT_AMBULATORY_CARE_PROVIDER_SITE_OTHER): Payer: 59 | Admitting: Family Medicine

## 2018-06-20 ENCOUNTER — Other Ambulatory Visit: Payer: Self-pay

## 2018-06-20 DIAGNOSIS — R0989 Other specified symptoms and signs involving the circulatory and respiratory systems: Secondary | ICD-10-CM | POA: Diagnosis not present

## 2018-06-20 NOTE — Progress Notes (Signed)
[facility] Telemedicine Visit  Patient consented to have virtual visit. Method of visit: Video  Encounter participants: Patient: Sheri Shepard - located at home Provider: Lind Covert - located at office Others (if applicable): no   Chief Complaint: Cough Lack of Taste   HPI:  Overall is improving.  No fever for about a week and cough is finally some better.  Still marked decrese in taste and smell.  Able to go up stairs with out shortness of breath  No edema Still nasally congested  ROS: per HPI  Pertinent PMHx: noted  Exam:  Respiratory: nasal congestion no evident shortness of breath Psych:  Cognition and judgment appear intact. Alert, communicative  and cooperative with normal attention span and concentration. No apparent delusions, illusions, hallucinations   Assessment/Plan:  Sinusitis Probable covid given possilble exposure at work and loss of taste smell Slowly improving Ok to return to work on Monday Call if any worsening     Time spent during visit with patient: 9 minutes

## 2018-06-20 NOTE — Assessment & Plan Note (Signed)
Probable covid given possilble exposure at work and loss of taste smell Slowly improving Ok to return to work on Monday Call if any worsening

## 2018-07-23 ENCOUNTER — Other Ambulatory Visit: Payer: Self-pay

## 2018-07-23 ENCOUNTER — Encounter: Payer: Self-pay | Admitting: Family Medicine

## 2018-07-23 ENCOUNTER — Ambulatory Visit: Payer: 59 | Admitting: Family Medicine

## 2018-07-23 DIAGNOSIS — R432 Parageusia: Secondary | ICD-10-CM

## 2018-07-23 NOTE — Assessment & Plan Note (Signed)
Presumably due to covid infection.  Recent article discussed olfactory training. See after visit summary for details

## 2018-07-23 NOTE — Progress Notes (Signed)
Subjective  Sheri Shepard is a 53 y.o. female is presenting with the following  LOSS OF TASTE SMELL Since April when treated for sinus infection with cough and fever.  She presumably had covid but was not tested.   All symptoms except feeling of morning sputum in her throat have resolved except her sense of smell and taste is still not very good.  No fever or shortness of breath or edema or chest pain or rash or face pain  Chief Complaint noted Review of Symptoms - see HPI PMH - Smoking status noted.    Objective Vital Signs reviewed BP (!) 148/88   Pulse 82   LMP 12/14/2011   SpO2 99%  Throat: normal mucosa, no exudate, uvula midline, no redness Neck:  No deformities, thyromegaly, masses, or tenderness noted.   Supple with full range of motion without pain. Nose:  External nasal examination shows no deformity or inflammation. Nasal mucosa are pink and moist without lesions or exudates. No septal dislocation or dislocation.No obstruction to airflow. Lungs:  Normal respiratory effort, chest expands symmetrically. Lungs are clear to auscultation, no crackles or wheezes. Extremities:  No cyanosis, edema, or deformity noted with good range of motion of all major joints.    Assessments/Plans   Loss of taste Presumably due to covid infection.  Recent article discussed olfactory training. See after visit summary for details

## 2018-07-23 NOTE — Patient Instructions (Addendum)
Good to see you today!  Thanks for coming in.  Take the clariten and flonase and mucinex regularly.  If not helping and you feel you have heart burn then try omeprazole OTC or call me or message me and I prescribe this   Olfactory training involves repeat and deliberate sniffing of a set of odorants (commonly lemon, rose, cloves, and eucalyptus) for 20 seconds each at least twice a day for at least 3 months (or longer if possible).   Studies have demonstrated improved olfaction in patients with postinfectious OD after olfactory training.9 Olfactory training can be considered for patients with persistent COVID-19-related OD because this therapy has low cost and negligible adverse effects.  Let me know how you are doing or if getting worse by My Chart in the next few weeks

## 2018-08-05 ENCOUNTER — Other Ambulatory Visit: Payer: Self-pay

## 2018-08-05 ENCOUNTER — Encounter: Payer: Self-pay | Admitting: Family Medicine

## 2018-08-05 ENCOUNTER — Ambulatory Visit: Payer: 59 | Admitting: Family Medicine

## 2018-08-05 VITALS — BP 124/80 | HR 89

## 2018-08-05 DIAGNOSIS — Z1211 Encounter for screening for malignant neoplasm of colon: Secondary | ICD-10-CM | POA: Diagnosis not present

## 2018-08-05 DIAGNOSIS — R432 Parageusia: Secondary | ICD-10-CM

## 2018-08-05 DIAGNOSIS — Z0000 Encounter for general adult medical examination without abnormal findings: Secondary | ICD-10-CM | POA: Diagnosis not present

## 2018-08-05 DIAGNOSIS — K2960 Other gastritis without bleeding: Secondary | ICD-10-CM | POA: Diagnosis not present

## 2018-08-05 MED ORDER — OMEPRAZOLE 20 MG PO CPDR: 20.0000 mg | DELAYED_RELEASE_CAPSULE | Freq: Every day | ORAL | 3 refills | Status: DC

## 2018-08-05 NOTE — Assessment & Plan Note (Signed)
Symptoms very consistent with this.  No red flags of food sticking or weight loss or bleeding.   Try PPI at night and suggest weight loss

## 2018-08-05 NOTE — Patient Instructions (Addendum)
Good to see you today!  Thanks for coming in.  Take the omeprazole one tab before bed.  If not helping in 3-4 days then take 2 tabs before bed.  I would continue for at least two weeks  If food is sticking or any bleeding then call me  Working on your weight should decrease the reflux  Ask Dr Matthew Saras to send me a copy of your pap result  The gastroenterorologist should call about the colonoscopy

## 2018-08-05 NOTE — Assessment & Plan Note (Signed)
Unchanged.  Hopefully will slowly improve after presumed covid exposure

## 2018-08-05 NOTE — Progress Notes (Signed)
Subjective  Sheri Shepard is a 53 y.o. female is presenting with the following  REFLUX For last few weeks feelings of heart burn with bad taste when lies down or drinks coffee.  Is a little better with tums or nexium otc in AM.  No vomiting or bleeding or dark tarry stools or fever  TASTE LOSS Not much change. Can not smell almost at all and taste only spicy foods.  Is trying the smell stimulation exercises  Chief Complaint noted Review of Symptoms - see HPI PMH - Smoking status noted.    Objective Vital Signs reviewed BP 124/80   Pulse 89   LMP 12/14/2011   SpO2 99%  Psych:  Cognition and judgment appear intact. Alert, communicative  and cooperative with normal attention span and concentration. No apparent delusions, illusions, hallucinations  Assessments/Plans  See after visit summary for details of patient instuctions  Loss of taste Unchanged.  Hopefully will slowly improve after presumed covid exposure   Reflux gastritis Symptoms very consistent with this.  No red flags of food sticking or weight loss or bleeding.   Try PPI at night and suggest weight loss

## 2018-08-14 ENCOUNTER — Encounter: Payer: Self-pay | Admitting: Gastroenterology

## 2018-08-14 ENCOUNTER — Ambulatory Visit (INDEPENDENT_AMBULATORY_CARE_PROVIDER_SITE_OTHER): Payer: 59 | Admitting: Gastroenterology

## 2018-08-14 VITALS — Ht 63.0 in | Wt 187.0 lb

## 2018-08-14 DIAGNOSIS — R1310 Dysphagia, unspecified: Secondary | ICD-10-CM | POA: Diagnosis not present

## 2018-08-14 DIAGNOSIS — K219 Gastro-esophageal reflux disease without esophagitis: Secondary | ICD-10-CM

## 2018-08-14 MED ORDER — NA SULFATE-K SULFATE-MG SULF 17.5-3.13-1.6 GM/177ML PO SOLN: 1.0000 | ORAL | 0 refills | Status: AC

## 2018-08-14 MED ORDER — OMEPRAZOLE 40 MG PO CPDR: 40.0000 mg | DELAYED_RELEASE_CAPSULE | Freq: Two times a day (BID) | ORAL | 3 refills | Status: DC

## 2018-08-14 NOTE — Patient Instructions (Addendum)
I have recommended a colonoscopy and an upper endoscopy.  Lifestyle and dietary modification reviewed: Take your proton pump inhibitor medication 30 minutes before meals Avoid any dietary triggers Avoid spicy and acidic foods Limit your intake of coffee, tea, alcohol, and carbonated drinks Work to maintain a healthy weight Keep the head of the bed elevated with blocks if you are having any nighttime symptoms Stay upright for 2 hours after eating Avoid meals and snacks three to four hours before bedtime  Thank you for your patience with me and our technology today! Please stay home, safe, and healthy. I look forward to meeting you in person in the future.

## 2018-08-14 NOTE — Progress Notes (Signed)
TELEHEALTH VISIT  Referring Provider: Lind Covert, * Primary Care Physician:  Lind Covert, MD   Tele-visit due to COVID-19 pandemic Patient requested visit virtually, consented to the virtual encounter via video enabled telemedicine application Contact made at: 10:28 08/14/18 Patient verified by name and date of birth Location of patient: Home Location provider: [facility] Names of persons participating: Me, patient, Tinnie Gens CMA Time spent on telehealth visit: 25 minutes I discussed the limitations of evaluation and management by telemedicine. The patient expressed understanding and agreed to proceed.  Reason for Consultation:  Acid reflux   IMPRESSION:  GERD intermittently responding to PPI therapy Intermittent dysphagia to solids Need for colon cancer screening No known family history of colon cancer or polyps  PLAN: Increase omeprazole to 40 mg daily Reviewed lifestyle modifications Work to maintain a health weight EGD with possible esophageal biopsies Colonoscopy for colon cancer screening  I consented the patient discussing the risks, benefits, and alternatives to endoscopic evaluation. In particular, we discussed the risks that include, but are not limited to, reaction to medication, cardiopulmonary compromise, bleeding requiring blood transfusion, aspiration resulting in pneumonia, perforation requiring surgery, lack of diagnosis, severe illness requiring hospitalization, and even death. We reviewed the risk of missed lesion including polyps or even cancer. The patient acknowledges these risks and asks that we proceed.  HPI: Sheri Shepard is a 53 y.o. female referred by Dr. [name] for further evaluation of reflux. The history is obtained by the patient and review of her electronic health record.   For last few weeks feelings she has heartburn with bad taste when lies down or drinks coffee.  Is a little better with Tums or  Nexium OTC in AM.  Exacerbated by pizza and spaghetti.   Dysphagia to solids noted at the sternal notch. Able to pass the bolus with swallows. No odynophagia, dysphonia, nausea, vomiting, sore throat, neck pain. No blood in the stool or changes in bowel habits. No other associated symptoms. No identified exacerbating or relieving features.   Had been waking her from sleep. Bad taste in the morning.  Symptoms improved after she started taking omeprazole BID but did not completely resolve.   She had presumed coronavirus in March. But her taste and smell have not yet returned.   No prior endoscopic evaluation. No prior abdominal imaging.   No known family history of colon cancer or polyps. No family history of uterine/endometrial cancer, pancreatic cancer or gastric/stomach cancer.  Past Medical History:  Diagnosis Date  . Allergy   . Bladder infection   . Fibroids 2012  . History of blood transfusion 1991   unsure of units transfused - at [facility]  . Seasonal allergies   . 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 Sickle cell trait ([place])   . Yeast infection     Past Surgical History:  Procedure Laterality Date  . ABDOMINAL HYSTERECTOMY    . CESAREAN SECTION  [phone]   x 3  . EYE MUSCLE SURGERY    . ROBOTIC ASSISTED TOTAL HYSTERECTOMY Bilateral 07/10/2012   Procedure: ROBOTIC ASSISTED TOTAL HYSTERECTOMY WITH BILATERAL SALPINGECTOMY;  Surgeon: Alwyn Pea, MD;  Location: [facility];  Service: Gynecology;  Laterality: Bilateral;  . TONSILLECTOMY AND ADENOIDECTOMY    . TUBAL LIGATION    . WISDOM TOOTH EXTRACTION      Current Outpatient Medications  Medication Sig Dispense Refill  . Calcium Carbonate-Vitamin D (CALTRATE 600+D PO) Take 2 tablets  by mouth daily. Reported on 08/10/2015    . fluticasone (FLONASE) 50 MCG/ACT nasal spray Place 2 sprays into both nostrils daily. 16 g 6  . Multiple Vitamins-Minerals (MULTIVITAMIN WITH MINERALS) tablet Take 1 tablet by mouth  daily. Reported on 08/10/2015    . omeprazole (PRILOSEC) 20 MG capsule Take 1 capsule (20 mg total) by mouth daily. 30 capsule 3   No current facility-administered medications for this visit.     Allergies as of 08/14/2018 - Review Complete 08/14/2018  Allergen Reaction Noted  . Tramadol hcl Shortness Of Breath and Itching 02/10/2008  . Ciprofloxacin Itching 11/23/2014  . Hydrocodone Itching 02/10/2008  . Penicillins Hives and Itching 02/10/2008  . Propoxyphene n-acetaminophen Itching 02/10/2008    Family History  Problem Relation Age of Onset  . Kidney failure Mother   . Hypertension Mother   . Stroke Mother   . Anemia Mother   . Diabetes Sister   . Diabetes Brother     Social History   Socioeconomic History  . Marital status: Single    Spouse name: Not on file  . Number of children: Not on file  . Years of education: Not on file  . Highest education level: Not on file  Occupational History  . Not on file  Social Needs  . Financial resource strain: Not on file  . Food insecurity    Worry: Not on file    Inability: Not on file  . Transportation needs    Medical: Not on file    Non-medical: Not on file  Tobacco Use  . Smoking status: Former Smoker    Types: Cigars    Quit date: 02/26/2010    Years since quitting: 8.4  . Smokeless tobacco: Never Used  Substance and Sexual Activity  . Alcohol use: Yes    Alcohol/week: 3.0 standard drinks    Types: 2 Standard drinks or equivalent, 1 Glasses of wine per week    Comment: weekends  . Drug use: No  . Sexual activity: Yes    Birth control/protection: Surgical    Comment: BTL   Lifestyle  . Physical activity    Days per week: Not on file    Minutes per session: Not on file  . Stress: Not on file  Relationships  . Social Herbalist on phone: Not on file    Gets together: Not on file    Attends religious service: Not on file    Active member of club or organization: Not on file    Attends meetings of  clubs or organizations: Not on file    Relationship status: Not on file  . Intimate partner violence    Fear of current or ex partner: Not on file    Emotionally abused: Not on file    Physically abused: Not on file    Forced sexual activity: Not on file  Other Topics Concern  . Not on file  Social History Narrative  . Not on file    Review of Systems: ALL ROS discussed and all others negative except listed in HPI.  Physical Exam: Complete physical exam not performed due to the limits inherent in a telehealth encounter.  General: Awake, alert, and oriented, and well communicative. In no acute distress.  HEENT: EOMI, non-icteric sclera, NCAT, MMM  Neck: Normal movement of head and neck  Pulm: No labored breathing, speaking in full sentences without conversational dyspnea  Derm: No apparent lesions or bruising in visible field  MS: Moves  all visible extremities without noticeable abnormality  Psych: Pleasant, cooperative, normal speech, normal affect and normal insight Neuro: Alert and appropriate   Amaiah Cristiano L. Tarri Glenn, MD, MPH [facility] 08/14/2018, 10:15 AM

## 2018-08-26 DIAGNOSIS — J343 Hypertrophy of nasal turbinates: Secondary | ICD-10-CM | POA: Insufficient documentation

## 2018-08-26 DIAGNOSIS — R430 Anosmia: Secondary | ICD-10-CM | POA: Insufficient documentation

## 2018-08-26 DIAGNOSIS — J310 Chronic rhinitis: Secondary | ICD-10-CM | POA: Insufficient documentation

## 2018-08-26 DIAGNOSIS — J342 Deviated nasal septum: Secondary | ICD-10-CM | POA: Insufficient documentation

## 2018-09-11 ENCOUNTER — Other Ambulatory Visit: Payer: Self-pay

## 2018-09-11 ENCOUNTER — Telehealth (INDEPENDENT_AMBULATORY_CARE_PROVIDER_SITE_OTHER): Payer: 59 | Admitting: Family Medicine

## 2018-09-11 DIAGNOSIS — J029 Acute pharyngitis, unspecified: Secondary | ICD-10-CM

## 2018-09-11 DIAGNOSIS — Z20828 Contact with and (suspected) exposure to other viral communicable diseases: Secondary | ICD-10-CM | POA: Diagnosis not present

## 2018-09-11 DIAGNOSIS — Z20822 Contact with and (suspected) exposure to covid-19: Secondary | ICD-10-CM | POA: Insufficient documentation

## 2018-09-11 NOTE — Progress Notes (Signed)
[place] Telemedicine Visit  Patient consented to have virtual visit. Method of visit: Video  Encounter participants: Patient: Sheri Shepard - located at home Provider: Cleophas Dunker - located at [facility] Others (if applicable): none  Chief Complaint: HA, sore throat  HPI: Patient states that since last night she has had a "bad headache."  States that she took Tylenol without improvement.  States that headache still preceded this morning.  Also notes that she has been having a progressively worsening sore throat.  Complains of drainage as well.  Denies any fever, chills, shortness of breath, chest pain.  States that she does have a history of allergies, and thinks that this could be what is causing her symptoms, but also had an exposure at work about 1 week ago to a confirmed [place] positive person.  She states that she went to work this morning, stayed about 1 hour, but then left given that she was having the symptoms.  ROS: per HPI  Pertinent PMHx: Gastritis, sickle cell trait  Exam:  Respiratory: speaking in complete sentences, no respiratory distress  Assessment/Plan:  Exposure to Covid-19 Virus Discussed with patient that COVID tests are taking multiple days to come back.  Also stated that given that her symptoms just started last night, it is not clear if this is truly COVID.  Reassured that she is breathing comfortably and is not in any respiratory distress, also does not have fevers.  After risks benefits discussion with the patient, decision was made to have her follow-up with a telephone visit on 7/20 to assess her symptoms and need for possible testing.  Patient was advised for the next 4 days to self quarantine, do not leave the house, avoid contact with others in her household.  She was given appropriate return precautions, voiced understanding.  Sore throat Symptomatic treatment.  Tylenol as needed.  Can also use warm salt water.    Time  spent during visit with patient: 15 minutes

## 2018-09-11 NOTE — Assessment & Plan Note (Signed)
Discussed with patient that COVID tests are taking multiple days to come back.  Also stated that given that her symptoms just started last night, it is not clear if this is truly COVID.  Reassured that she is breathing comfortably and is not in any respiratory distress, also does not have fevers.  After risks benefits discussion with the patient, decision was made to have her follow-up with a telephone visit on 7/20 to assess her symptoms and need for possible testing.  Patient was advised for the next 4 days to self quarantine, do not leave the house, avoid contact with others in her household.  She was given appropriate return precautions, voiced understanding.

## 2018-09-11 NOTE — Assessment & Plan Note (Signed)
Symptomatic treatment.  Tylenol as needed.  Can also use warm salt water.

## 2018-09-15 ENCOUNTER — Telehealth (INDEPENDENT_AMBULATORY_CARE_PROVIDER_SITE_OTHER): Payer: 59 | Admitting: Family Medicine

## 2018-09-15 ENCOUNTER — Encounter: Payer: Self-pay | Admitting: Family Medicine

## 2018-09-15 ENCOUNTER — Other Ambulatory Visit: Payer: Self-pay

## 2018-09-15 DIAGNOSIS — J029 Acute pharyngitis, unspecified: Secondary | ICD-10-CM | POA: Diagnosis not present

## 2018-09-15 NOTE — Progress Notes (Signed)
[place] Telemedicine Visit  Patient consented to have virtual visit. Method of visit: Video  Encounter participants: Patient: Sheri Shepard - located at car Provider: Cleophas Dunker - located at [facility] Others (if applicable): none  Chief Complaint: follow up of sore throat and headache  HPI: Patient presenting for video visit today for follow-up of previous video visit on 7/16.  Patient had an exposure to COVID-19, and was having sore throat and headache.  She notes that over the weekend, this is improved.  She noticed that her sore throat would occur after having worsening of her reflux at night.  She also stated that her headaches improved.  She denies any cough, fever, shortness of breath.  She states that she is in her normal state of health at present.  ROS: per HPI  Pertinent PMHx: Gastritis, sickle cell trait  Exam:  Respiratory: Speaking in complete sentences, no evidence of respiratory distress  Assessment/Plan:  Sore throat Given the patient's symptoms have improved, she has been without fever, shortness of breath, and sore throat occurs only after worsening and reflux at night, doubt that the symptoms are caused by COVID-19.  Patient agrees that she does not think that her symptoms are COVID related and declines testing.  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    Time spent during visit with patient: 5 minutes

## 2018-09-15 NOTE — Assessment & Plan Note (Addendum)
Given the patient's symptoms have improved, she has been without fever, shortness of breath, and sore throat occurs only after worsening and reflux at night, doubt that the symptoms are caused by COVID-19.  Patient agrees that she does not think that her symptoms are COVID related and declines testing.  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

## 2018-09-26 ENCOUNTER — Telehealth: Payer: Self-pay | Admitting: Gastroenterology

## 2018-09-26 NOTE — Telephone Encounter (Signed)

## 2018-09-27 ENCOUNTER — Encounter: Payer: Self-pay | Admitting: Gastroenterology

## 2018-09-27 ENCOUNTER — Ambulatory Visit (AMBULATORY_SURGERY_CENTER): Payer: 59 | Admitting: Gastroenterology

## 2018-09-27 ENCOUNTER — Encounter

## 2018-09-27 ENCOUNTER — Other Ambulatory Visit: Payer: Self-pay

## 2018-09-27 VITALS — BP 139/83 | HR 82 | Temp 98.6°F | Resp 22 | Ht 63.0 in | Wt 187.0 lb

## 2018-09-27 DIAGNOSIS — Z1211 Encounter for screening for malignant neoplasm of colon: Secondary | ICD-10-CM | POA: Diagnosis present

## 2018-09-27 DIAGNOSIS — K210 Gastro-esophageal reflux disease with esophagitis: Secondary | ICD-10-CM

## 2018-09-27 DIAGNOSIS — K635 Polyp of colon: Secondary | ICD-10-CM | POA: Diagnosis not present

## 2018-09-27 DIAGNOSIS — K219 Gastro-esophageal reflux disease without esophagitis: Secondary | ICD-10-CM

## 2018-09-27 DIAGNOSIS — K2970 Gastritis, unspecified, without bleeding: Secondary | ICD-10-CM

## 2018-09-27 MED ORDER — SODIUM CHLORIDE 0.9 % IV SOLN: 500.0000 mL | Freq: Once | INTRAVENOUS | Status: DC

## 2018-09-27 NOTE — Progress Notes (Signed)
Called to room to assist during endoscopic procedure.  Patient ID and intended procedure confirmed with present staff. Received instructions for my participation in the procedure from the performing physician.  

## 2018-09-27 NOTE — Op Note (Addendum)
[place] Patient Name: Sheri Shepard Procedure Date: 09/27/2018 8:26 AM MRN: [id number] Endoscopist: Thornton Park MD, MD Age: 53 Referring MD:  Date of Birth: 1965/05/08 Gender: Female Account #: [id number] Procedure:                Upper GI endoscopy Indications:              Esophageal reflux symptoms that persist despite                            appropriate therapy Medicines:                See the Anesthesia note for documentation of the                            administered medications Procedure:                Pre-Anesthesia Assessment:                           - Prior to the procedure, a History and Physical                            was performed, and patient medications and                            allergies were reviewed. The patient's tolerance of                            previous anesthesia was also reviewed. The risks                            and benefits of the procedure and the sedation                            options and risks were discussed with the patient.                            All questions were answered, and informed consent                            was obtained. Prior Anticoagulants: The patient has                            taken no previous anticoagulant or antiplatelet                            agents. ASA Grade Assessment: II - A patient with                            mild systemic disease. After reviewing the risks                            and benefits, the patient was deemed in  satisfactory condition to undergo the procedure.                           After obtaining informed consent, the endoscope was                            passed under direct vision. Throughout the                            procedure, the patient's blood pressure, pulse, and                            oxygen saturations were monitored continuously. The                            Endoscope was introduced through  the mouth, and                            advanced to the second part of duodenum. The upper                            GI endoscopy was accomplished without difficulty.                            The patient tolerated the procedure well. Scope In: Scope Out: Findings:                 LA Grade A (one or more mucosal breaks less than 5                            mm, not extending between tops of 2 mucosal folds)                            esophagitis with no bleeding was found. Biopsies                            were taken with a cold forceps for histology from                            the distal esophagus and from the mid and proximal                            esophagus. Estimated blood loss was minimal.                           Mild inflammation was found in the gastric body.                            Biopsies were taken with a cold forceps for                            histology. Estimated blood loss was minimal.  Patchy mildly erythematous mucosa without active                            bleeding and with no stigmata of bleeding was found                            in the duodenal bulb and the second portion of the                            duodenum. Biopsies were taken with a cold forceps                            for histology.                           The cardia and gastric fundus were normal on                            retroflexion.                           The exam was otherwise without abnormality. Complications:            No immediate complications. Estimated blood loss:                            Minimal. Estimated Blood Loss:     Estimated blood loss was minimal. Impression:               - LA Grade A reflux esophagitis. Biopsied.                           - Mild gastritis. Biopsied.                           - Erythematous duodenopathy. Biopsied.                           - The examination was otherwise normal. Recommendation:            - Await pathology results.                           - Patient has a contact number available for                            emergencies. The signs and symptoms of potential                            delayed complications were discussed with the                            patient. Return to normal activities tomorrow.                            Written discharge instructions were provided to the  patient.                           - Resume regular diet today.                           - Continue present medications including omeprazole                            40 mg daily x 8 weeks.                           - Avoid all NSAIDs. Thornton Park MD, MD 09/27/2018 9:02:51 AM This report has been signed electronically.

## 2018-09-27 NOTE — Progress Notes (Signed)
Temp by Wyline Mood by Hiram Comber

## 2018-09-27 NOTE — Op Note (Signed)
[place] Patient Name: Sheri Shepard Procedure Date: 09/27/2018 8:25 AM MRN: [id number] Endoscopist: Thornton Park MD, MD Age: 53 Referring MD:  Date of Birth: 1966-02-20 Gender: Female Account #: [id number] Procedure:                Colonoscopy Indications:              Screening for colorectal malignant neoplasm, This                            is the patient's first colonoscopy                           No known family history of colon cancer or polyps                           No baseline GI symptoms Medicines:                See the Anesthesia note for documentation of the                            administered medications Procedure:                Pre-Anesthesia Assessment:                           - Prior to the procedure, a History and Physical                            was performed, and patient medications and                            allergies were reviewed. The patient's tolerance of                            previous anesthesia was also reviewed. The risks                            and benefits of the procedure and the sedation                            options and risks were discussed with the patient.                            All questions were answered, and informed consent                            was obtained. Prior Anticoagulants: The patient has                            taken no previous anticoagulant or antiplatelet                            agents. ASA Grade Assessment: II - A patient with  mild systemic disease. After reviewing the risks                            and benefits, the patient was deemed in                            satisfactory condition to undergo the procedure.                           After obtaining informed consent, the colonoscope                            was passed under direct vision. Throughout the                            procedure, the patient's blood pressure, pulse, and                             oxygen saturations were monitored continuously. The                            Colonoscope was introduced through the anus and                            advanced to the the terminal ileum, with                            identification of the appendiceal orifice and IC                            valve. A second forward view of the right colon was                            performed. The colonoscopy was performed without                            difficulty. The patient tolerated the procedure                            well. The quality of the bowel preparation was good. Scope In: 8:44:12 AM Scope Out: 8:57:57 AM Scope Withdrawal Time: 0 hours 10 minutes 45 seconds  Total Procedure Duration: 0 hours 13 minutes 45 seconds  Findings:                 The perianal and digital rectal examinations were                            normal.                           A less than 1 mm possible polyp was found in the                            splenic flexure. The "polyp" was sessile. The                            "  polyp" was removed with a cold biopsy forceps.                            Resection and retrieval were complete. Estimated                            blood loss was minimal.                           Non-bleeding internal hemorrhoids were found.                           The exam was otherwise without abnormality on                            direct and retroflexion views. Complications:            No immediate complications. Estimated blood loss:                            Minimal. Estimated Blood Loss:     Estimated blood loss was minimal. Impression:               - One less than 1 mm possible polyp at the splenic                            flexure, removed with a cold biopsy forceps.                            Resected and retrieved.                           - Non-bleeding internal hemorrhoids.                           - The examination was otherwise  normal on direct                            and retroflexion views. Recommendation:           - Patient has a contact number available for                            emergencies. The signs and symptoms of potential                            delayed complications were discussed with the                            patient. Return to normal activities tomorrow.                            Written discharge instructions were provided to the                            patient.                           -  Resume regular diet today.                           - Continue present medications.                           - Await pathology results.                           - Repeat colonoscopy in 7 years for surveillance if                            the "polyp" is actually a polyp. Thornton Park MD, MD 09/27/2018 9:06:41 AM This report has been signed electronically.

## 2018-09-27 NOTE — Patient Instructions (Signed)
Continue present dose of omeprazole, and avoid all NSAIDS: aspirin, aleve and ibprofen  YOU HAD AN ENDOSCOPIC PROCEDURE TODAY AT [facility]:   Refer to the procedure report that was given to you for any specific questions about what was found during the examination.  If the procedure report does not answer your questions, please call your gastroenterologist to clarify.  If you requested that your care partner not be given the details of your procedure findings, then the procedure report has been included in a sealed envelope for you to review at your convenience later.  YOU SHOULD EXPECT: Some feelings of bloating in the abdomen. Passage of more gas than usual.  Walking can help get rid of the air that was put into your GI tract during the procedure and reduce the bloating. If you had a lower endoscopy (such as a colonoscopy or flexible sigmoidoscopy) you may notice spotting of blood in your stool or on the toilet paper. If you underwent a bowel prep for your procedure, you may not have a normal bowel movement for a few days.  Please Note:  You might notice some irritation and congestion in your nose or some drainage.  This is from the oxygen used during your procedure.  There is no need for concern and it should clear up in a day or so.  SYMPTOMS TO REPORT IMMEDIATELY:   Following lower endoscopy (colonoscopy or flexible sigmoidoscopy):  Excessive amounts of blood in the stool  Significant tenderness or worsening of abdominal pains  Swelling of the abdomen that is new, acute  Fever of 100F or higher   Following upper endoscopy (EGD)  Vomiting of blood or coffee ground material  New chest pain or pain under the shoulder blades  Painful or persistently difficult swallowing  New shortness of breath  Fever of 100F or higher  Black, tarry-looking stools  For urgent or emergent issues, a gastroenterologist can be reached at any hour by calling [phone].   DIET:  We  do recommend a small meal at first, but then you may proceed to your regular diet.  Drink plenty of fluids but you should avoid alcoholic beverages for 24 hours.  ACTIVITY:  You should plan to take it easy for the rest of today and you should NOT DRIVE or use heavy machinery until tomorrow (because of the sedation medicines used during the test).    FOLLOW UP: Our staff will call the number listed on your records 48-72 hours following your procedure to check on you and address any questions or concerns that you may have regarding the information given to you following your procedure. If we do not reach you, we will leave a message.  We will attempt to reach you two times.  During this call, we will ask if you have developed any symptoms of COVID 19. If you develop any symptoms (ie: fever, flu-like symptoms, shortness of breath, cough etc.) before then, please call [phone].  If you test positive for Covid 19 in the 2 weeks post procedure, please call and report this information to [place].    If any biopsies were taken you will be contacted by phone or by letter within the next 1-3 weeks.  Please call us at [phone] if you have not heard about the biopsies in 3 weeks.    SIGNATURES/CONFIDENTIALITY: You and/or your care partner have signed paperwork which will be entered into your electronic medical record.  These signatures attest to the fact that  that the information above on your After Visit Summary has been reviewed and is understood.  Full responsibility of the confidentiality of this discharge information lies with you and/or your care-partner.

## 2018-10-01 ENCOUNTER — Telehealth: Payer: Self-pay

## 2018-10-01 NOTE — Telephone Encounter (Signed)
No answer, left message to call if having any issues or concerns, B.Shanterica Biehler RN 

## 2018-10-06 ENCOUNTER — Telehealth: Payer: Self-pay | Admitting: Gastroenterology

## 2018-10-06 NOTE — Telephone Encounter (Signed)
Patient notified pathology results have not been review and that she would be notified via a phone call or a letter of the results.

## 2018-10-07 ENCOUNTER — Encounter: Payer: Self-pay | Admitting: Gastroenterology

## 2018-10-07 NOTE — Telephone Encounter (Signed)
My apologies for the delay.  Path letter created today.  Please see the letter for full details.  I am hoping that the patient will take omeprazole 40 mg daily for 8 weeks to improve her symptoms.  Please offer her a follow-up visit if this treatment does not provide improvement.  Thank you.

## 2018-10-07 NOTE — Telephone Encounter (Signed)
This patient is awaiting surgical path results.

## 2018-10-07 NOTE — Telephone Encounter (Addendum)
Spoke with patient, read the patient the result letter. The patient was relieved and will continue to take the omeprazole and will call the office with additional questions or concerns.

## 2018-12-30 DIAGNOSIS — J0141 Acute recurrent pansinusitis: Secondary | ICD-10-CM | POA: Insufficient documentation

## 2019-02-17 ENCOUNTER — Telehealth (INDEPENDENT_AMBULATORY_CARE_PROVIDER_SITE_OTHER): Payer: 59 | Admitting: Family Medicine

## 2019-02-17 ENCOUNTER — Other Ambulatory Visit: Payer: Self-pay

## 2019-02-17 NOTE — Progress Notes (Signed)
Attempted to call patient at number listed in chart x2 with no answer.  Left voicemail x2.  We will no charge patient.  If still needs to be seen can be worked in with same-day provider or perhaps scheduled for tomorrow.  Guadalupe Dawn MD PGY-3 Family Medicine Resident

## 2019-05-06 ENCOUNTER — Encounter: Payer: 59 | Admitting: Family Medicine

## 2019-05-27 ENCOUNTER — Telehealth: Payer: Self-pay

## 2019-05-27 ENCOUNTER — Ambulatory Visit (INDEPENDENT_AMBULATORY_CARE_PROVIDER_SITE_OTHER): Payer: 59 | Admitting: Family Medicine

## 2019-05-27 ENCOUNTER — Encounter: Payer: Self-pay | Admitting: Family Medicine

## 2019-05-27 ENCOUNTER — Other Ambulatory Visit: Payer: Self-pay

## 2019-05-27 VITALS — BP 146/88 | HR 86 | Ht 63.0 in | Wt 197.8 lb

## 2019-05-27 DIAGNOSIS — R432 Parageusia: Secondary | ICD-10-CM

## 2019-05-27 DIAGNOSIS — R635 Abnormal weight gain: Secondary | ICD-10-CM

## 2019-05-27 DIAGNOSIS — Z13220 Encounter for screening for lipoid disorders: Secondary | ICD-10-CM

## 2019-05-27 DIAGNOSIS — Z131 Encounter for screening for diabetes mellitus: Secondary | ICD-10-CM | POA: Diagnosis not present

## 2019-05-27 DIAGNOSIS — K2960 Other gastritis without bleeding: Secondary | ICD-10-CM

## 2019-05-27 LAB — POCT GLYCOSYLATED HEMOGLOBIN (HGB A1C): Hemoglobin A1C: 6 % — AB (ref 4.0–5.6)

## 2019-05-27 NOTE — Assessment & Plan Note (Signed)
Worsening.  See after visit summary for plans  

## 2019-05-27 NOTE — Assessment & Plan Note (Signed)
Well controlled 

## 2019-05-27 NOTE — Assessment & Plan Note (Signed)
Started with Covid infection.  Following with ENT.  No improvement

## 2019-05-27 NOTE — Telephone Encounter (Signed)
Patient calls nurse line stating she would like to get the covid vaccine, however is concerned because she has so many medication allergies. Per chart review, Tramadol is the only medication that causes SOB. Will forward to PCP to advise.

## 2019-05-27 NOTE — Patient Instructions (Addendum)
Good to see you today!  Thanks for coming in.  Homework  1.  Find fun things to do  2.  Consider ways to lower your weight   Exercise - 3x per week - walk the neighbor  Diet - more veggies.  Decrease candy - substitute veggies like carrots or celery   3. Monitor your blood pressure  Check it every 1-2 weeks   If its regularly > 150/90 then come back to discuss  I will send you a letter about your tests  Consider setting up MyChart

## 2019-05-27 NOTE — Progress Notes (Signed)
    SUBJECTIVE:   CHIEF COMPLAINT / HPI:   Her for check up Overall feels well Has been gaining weight Still no taste from Covid infection  No regular exercise Sees [facility] for gyn care No regular medications   PERTINENT  PMH / PSH: Covid infection spring 2020   OBJECTIVE:   BP (!) 146/88   Pulse 86   Ht 5\' 3"  (1.6 m)   Wt 197 lb 12.8 oz (89.7 kg)   LMP 12/14/2011   SpO2 98%   BMI 35.04 kg/m   Heart - Regular rate and rhythm.  No murmurs, gallops or rubs.    Lungs:  Normal respiratory effort, chest expands symmetrically. Lungs are clear to auscultation, no crackles or wheezes. Abdomen: soft and non-tender without masses, organomegaly or hernias noted.  No guarding or rebound Extremities:  No cyanosis, edema, or deformity noted with good range of motion of all major joints.   Neck:  No deformities, thyromegaly, masses, or tenderness noted.   Supple with full range of motion without pain. Skin:  Intact without suspicious lesions or rashes   ASSESSMENT/PLAN:   HM - see after visit summary Check lipids  Loss of taste Started with Covid infection.  Following with ENT.  No improvement   Weight gain Worsening.  See after visit summary for plans   Reflux gastritis Well controlled      Lind Covert, MD [place]

## 2019-05-28 ENCOUNTER — Encounter: Payer: Self-pay | Admitting: Family Medicine

## 2019-05-28 LAB — LIPID PANEL
Chol/HDL Ratio: 4.7 ratio — ABNORMAL HIGH (ref 0.0–4.4)
Cholesterol, Total: 186 mg/dL (ref 100–199)
HDL: 40 mg/dL (ref >39)
LDL Chol Calc (NIH): 132 mg/dL — ABNORMAL HIGH (ref 0–99)
Triglycerides: 77 mg/dL (ref 0–149)
VLDL Cholesterol Cal: 14 mg/dL (ref 5–40)

## 2019-05-28 NOTE — Telephone Encounter (Signed)
I sent her a letter answering this and informing her of her labs

## 2019-09-02 ENCOUNTER — Ambulatory Visit: Payer: 59 | Admitting: Family Medicine

## 2019-09-08 ENCOUNTER — Ambulatory Visit: Payer: 59 | Admitting: Family Medicine

## 2019-10-08 ENCOUNTER — Encounter: Payer: Self-pay | Admitting: Family Medicine

## 2019-10-08 ENCOUNTER — Ambulatory Visit: Payer: 59 | Admitting: Family Medicine

## 2019-10-08 ENCOUNTER — Other Ambulatory Visit: Payer: Self-pay

## 2019-10-08 VITALS — BP 110/70 | HR 94 | Ht 64.0 in | Wt 195.0 lb

## 2019-10-08 DIAGNOSIS — M6790 Unspecified disorder of synovium and tendon, unspecified site: Secondary | ICD-10-CM | POA: Insufficient documentation

## 2019-10-08 NOTE — Progress Notes (Signed)
    SUBJECTIVE:   CHIEF COMPLAINT / HPI: Knot on hand/discuss tdap  Sheri Shepard is a 54 year old female presenting discussed the following:  Knot on hand: She recently noticed this about 1 week ago, left hand and palmar surface.  It is not bothering her or painful, just worried and wanted to make sure this was not something dangerous.  No associated finger clicking/locking, contracture, rash, weakness, or numbness/tingling.  No preceding trauma.   Additionally her daughter-in-law is about to have a child and wants to make sure that she is up-to-date on her Tdap.   Health maintenance: Appears she is due for her mammogram and Pap smear, however states she follows with OB/GYN and just had these completed last year and normal.  Considering the Covid vaccine however wants to take more time to think about it.  Still has no taste/smell from previous Covid infection.  PERTINENT  PMH / PSH: History of carpal tunnel bilaterally, sickle cell trait, loss of taste s/p Covid infection  OBJECTIVE:   BP 110/70   Pulse 94   Ht 5\' 4"  (1.626 m)   Wt 195 lb (88.5 kg)   LMP 12/14/2011   SpO2 99%   BMI 33.47 kg/m   General: Alert, NAD, pleasant female appears stated age Lungs: No increased WOB  Ext: Warm, dry, 2+ radial pulses  Hands/MSK: Small approximate 0.5 cm nontender hard nodule adhered to flexor tendon of fifth left digit, overlying distal fifth metacarpal.  Mobile with tendon movement of fifth digit.  No overlying skin erythema or swelling.  No contractures present.  5/5 strength with finger abduction/abduction, elbow F/E.  Sensation to light touch intact throughout.  ASSESSMENT/PLAN:   Nodule of flexor tendon sheath Suspected Dupuytren's nodule without any concurrent finger locking or contracture present, suggesting against tenosynovitis or Dupuytren's contracture.  Asymptomatic.  Provided reassurance and recommended observation.  Follow-up if enlarging, associated pain, finger locking/clicking, or  development of contracture.     Follow-up if become symptomatic for above, otherwise encouraged follow-up for first Covid vaccine when ready.  Sheri Shepard, [place]

## 2019-10-08 NOTE — Assessment & Plan Note (Addendum)
Suspected Dupuytren's nodule without any concurrent finger locking or contracture present, suggesting against tenosynovitis or Dupuytren's contracture.  Asymptomatic.  Provided reassurance and recommended observation.  Follow-up if enlarging, associated pain, finger locking/clicking, or development of contracture.

## 2019-10-08 NOTE — Patient Instructions (Signed)
It was wonderful to meet you today.  It is likely the bump in your hand is called a "dupuytren nodule" which is just a buildup of tendon tissue.  Make sure you avoid any injury to this area or excessive pressure.  Sometimes these can go away on their own, however please follow-up if you notice it getting bigger or associated with significant pain, any locking of your finger, or persistent bending of your finger towards your palm.  Continue to consider getting the Covid vaccine when you are ready, we have the Box in our clinic.

## 2019-10-13 ENCOUNTER — Other Ambulatory Visit: Payer: Self-pay | Admitting: Gastroenterology

## 2019-12-02 ENCOUNTER — Other Ambulatory Visit: Payer: Self-pay

## 2019-12-02 ENCOUNTER — Ambulatory Visit (INDEPENDENT_AMBULATORY_CARE_PROVIDER_SITE_OTHER): Payer: 59

## 2019-12-02 DIAGNOSIS — Z23 Encounter for immunization: Secondary | ICD-10-CM | POA: Diagnosis not present

## 2019-12-02 NOTE — Progress Notes (Signed)
   Covid-19 Vaccination Clinic  Name:  Davelyn Gwinn    MRN: [id number] DOB: 1965/11/07  12/02/2019  Ms. Buikema was observed post Covid-19 immunization for 15 minutes without incident. She was provided with Vaccine Information Sheet and instruction to access the V-Safe system.   Ms. Roderick was instructed to call 911 with any severe reactions post vaccine: Marland Kitchen Difficulty breathing  . Swelling of face and throat  . A fast heartbeat  . A bad rash all over body  . Dizziness and weakness   #1 Covid Vaccine administered LD without complication. #2 Covid Vaccine due 12/23/2019.

## 2019-12-23 ENCOUNTER — Other Ambulatory Visit: Payer: Self-pay

## 2019-12-23 ENCOUNTER — Ambulatory Visit (INDEPENDENT_AMBULATORY_CARE_PROVIDER_SITE_OTHER): Payer: 59

## 2019-12-23 DIAGNOSIS — Z23 Encounter for immunization: Secondary | ICD-10-CM | POA: Diagnosis not present

## 2019-12-23 NOTE — Progress Notes (Signed)
   Covid-19 Vaccination Clinic  Name:  Zakkiyya Barno    MRN: [id number] DOB: 1965-07-18  12/23/2019  Ms. Dobberstein was observed post Covid-19 immunization for 15 minutes without incident. She was provided with Vaccine Information Sheet and instruction to access the V-Safe system.   Ms. Kistler was instructed to call 911 with any severe reactions post vaccine: Marland Kitchen Difficulty breathing  . Swelling of face and throat  . A fast heartbeat  . A bad rash all over body  . Dizziness and weakness   #2 Covid Vaccine administered RD without complication.

## 2020-03-30 ENCOUNTER — Ambulatory Visit (INDEPENDENT_AMBULATORY_CARE_PROVIDER_SITE_OTHER): Payer: 59 | Admitting: Family Medicine

## 2020-03-30 VITALS — BP 140/80 | HR 82 | Temp 98.2°F

## 2020-03-30 DIAGNOSIS — J329 Chronic sinusitis, unspecified: Secondary | ICD-10-CM

## 2020-03-30 DIAGNOSIS — J069 Acute upper respiratory infection, unspecified: Secondary | ICD-10-CM

## 2020-03-30 MED ORDER — FLUTICASONE PROPIONATE 50 MCG/ACT NA SUSP: 2.0000 | Freq: Every day | NASAL | 6 refills | Status: DC

## 2020-03-30 NOTE — Assessment & Plan Note (Signed)
Acute. Pain to palpation over left maxillary sinus.  Hold off on antibiotics at this time Plan as above.  If no improvement or worsening despite symptomatic treatment, will call in Abx.

## 2020-03-30 NOTE — Patient Instructions (Signed)
It was a pleasure to see you today!  Thank you for choosing [facility] for your primary care.   Our plans for today were:  I have tested you for COVID. I will call you with the results. IN the mean time I would assume positive and isolate until you get your results.  Treat your symptoms with Flonase: two puffs in each nostril daily, Mucinex, cough medicine as needed.  Tylenol / ibuprofen as needed for discomfort  If your facial pain does not improve or worsens over trh next few days let me know and I will call you in an antibiotic.  Below are other recommendations to help with your symptoms  Please return or report to ED if you develop pain with you take deep breaths, shortness of breath/difficulty breathing, high fevers that dont respond to tylenol or persistent fevers (>100.4), blood when you cough   Best Wishes,   Mina Marble, DO  What You Can Do to Feel Better When You Have a Viral Illness Common Symptoms: runny eyes, muscle aches, throat irritation, ear pain, cough, sneezing, runny nose or congested nose, sinus pressure, headache, fever, fatigue   For your cough, try these:  Teaspoon of honey either alone or mixed with warm water  Tessalon pearls  Lozenges or hard candies  Laying with head elevated  Vicks/menthol rub topically on chest   For your congestion, try these:   Steroid nasal spray such as fluticasone or budesonide- helps prevent swelling in the nasal passage  Guaifenesin (mucinex) - helps thin the mucus  Steam- in a closed bathroom with hot shower running or a bedside humidifier  Drinking plenty of fluids to stay hydrated can help thin mucus   For your runny nose:   Atrovent nasal spray- helps decrease the drainage (can lead to dryness if overdone)  Nasal rinses such as a netty pot or a bulb syringe using filtered water mixed with small amount of baking soda and/or sea salt   For your fever:   Acetaminophen (Tylenol) up to 4g per  day for most people  Ibuprofen 600mg  up to three times per day for most people   For your sore throat:  Drinking either warm or cold liquids (whichever feels best to you)  Gargling warm salt water     Help prevent spreading of infection to others.  Wash your hands frequently  Avoid crowded places  Wear a mask when in public  Get your regularly scheduled vaccinations as they are recommended by the CDC.   Fun facts: -Antibiotics treat bacteria and have no effect on viruses so are not helpful in the vast majority of upper respiratory illnesses which are caused by common cold or flu viruses.  -Generic over the counter (OTC) medications have the same active ingredients and effectiveness of the more expensive name-brand version. -Vaccines are available to prevent infection with several of the most infectious/deadly viruses.

## 2020-03-30 NOTE — Assessment & Plan Note (Signed)
Acute x 2-3 days. Hemodynamically stable on room air. No signs of acute bacterial infection. Highly suspicious of COVID - covid testing obtained today. Lower suspicion for bacterial sinusitis at this this given duration of symptoms. Recommended symptomatic treatment. If no improvement within a few days, recommended to call clinic and can discuss antibiotic at that time. Patient voiced understanding and agreement with plan.  - Follow up COVID testing - Flonase daily, Mucinex - symptomatic treatment reviewed - see AVS - Return precautions discussed. Patient voiced understanding and agreement with plan

## 2020-03-30 NOTE — Progress Notes (Signed)
Subjective:   Patient ID: Sheri Shepard    DOB: [birth year], 55 y.o. female   MRN: [id number]  Sheri Shepard is a 55 y.o. female with a history of reflux gastritis, carpel tunnel syndrome, sickle cell trait here for cough, headache  Cough  Headache: Patient notes that she has had a slight frontal headache, sore throat, cough, pressure under left eye, raspy voice x 2-3 days. She feels like she has chest congestion. She has a history of COVID infection back in 2020. Has been vaccinated x 2. Denies any fevers, chills, SOB, difficulty breathing, nausea, vomiting, diarrhea. She does endorse decreased PO intake but has been staying well hydrated. She endorses more fatigue. She has been treating her symptoms with zyrtec and mucinex.   Review of Systems:  Per HPI.   Objective:   BP 140/80   Pulse 82   Temp 98.2 F (36.8 C) (Oral)   LMP 12/14/2011   SpO2 98%  Vitals and nursing note reviewed.  General: pleasant older female, sitting comfortably in exam chair, well nourished, well developed, in no acute distress with non-toxic appearance HEENT: normocephalic, atraumatic, moist mucous membranes, oropharynx clear without exudate but cobblestoning present in posterior orophyranx, TM normal bilaterally, swollen bilaterally moist erythematous turbinates, pain to palpation of left maxillary sinus CV: regular rate and rhythm without murmurs, rubs, or gallops Lungs: clear to auscultation bilaterally with normal work of breathing on room air, speaking in full sentences Skin: warm, dry Extremities: warm and well perfused MSK:  gait normal Neuro: Alert and oriented, speech normal  Assessment & Plan:   Viral URI with cough Acute x 2-3 days. Hemodynamically stable on room air. No signs of acute bacterial infection. Highly suspicious of COVID - covid testing obtained today. Lower suspicion for bacterial sinusitis at this this given duration of symptoms. Recommended symptomatic treatment. If no  improvement within a few days, recommended to call clinic and can discuss antibiotic at that time. Patient voiced understanding and agreement with plan.  - Follow up COVID testing - Flonase daily, Mucinex - symptomatic treatment reviewed - see AVS - Return precautions discussed. Patient voiced understanding and agreement with plan  Sinusitis Acute. Pain to palpation over left maxillary sinus.  Hold off on antibiotics at this time Plan as above.  If no improvement or worsening despite symptomatic treatment, will call in Abx.  Orders Placed This Encounter  Procedures  . Novel Coronavirus, NAA ([facility])    Order Specific Question:   Is this test for diagnosis or screening    Answer:   Diagnosis of ill patient    Order Specific Question:   Symptomatic for COVID-19 as defined by CDC    Answer:   Yes    Order Specific Question:   Date of Symptom Onset    Answer:   03/23/2020    Order Specific Question:   Hospitalized for COVID-19    Answer:   No    Order Specific Question:   Admitted to ICU for COVID-19    Answer:   No    Order Specific Question:   Previously tested for COVID-19    Answer:   No    Order Specific Question:   Resident in a congregate (group) care setting    Answer:   No    Order Specific Question:   Is the patient student?    Answer:   No    Order Specific Question:   Employed in healthcare setting    Answer:   No  Order Specific Question:   Pregnant    Answer:   No    Order Specific Question:   Has patient completed COVID vaccination(s) (2 doses of Pfizer/Moderna 1 dose of [employer])    Answer:   Yes    Order Specific Question:   Has patient completed COVID Booster / 3rd dose    Answer:   No    Order Specific Question:   Release to patient    Answer:   Immediate   Meds ordered this encounter  Medications  . fluticasone (FLONASE) 50 MCG/ACT nasal spray    Sig: Place 2 sprays into both nostrils daily.    Dispense:  16 g    Refill:  [place], DO PGY-3, [facility] 03/30/2020 9:36 PM

## 2020-04-02 LAB — NOVEL CORONAVIRUS, NAA: SARS-CoV-2, NAA: NOT DETECTED

## 2020-04-02 LAB — SARS-COV-2, NAA 2 DAY TAT

## 2020-04-27 NOTE — Progress Notes (Deleted)
    SUBJECTIVE:   CHIEF COMPLAINT / HPI:   Sinus drainage/persistent cough: Patient is a 55 year old female that presents today for sinus drainage and persistent cough which has been going on for***.  PERTINENT  PMH / PSH: ***  OBJECTIVE:   LMP 12/14/2011    General: NAD, pleasant, able to participate in exam HEENT: No pharyngeal erythema, nasal congestion present*** Cardiac: RRR, no murmurs. Respiratory: CTAB, normal effort, No wheezes, rales or rhonchi Psych: Normal affect and mood  ASSESSMENT/PLAN:   No problem-specific Assessment & Plan notes found for this encounter.   Assessment: 55 year old female presenting with sinus drainage and persistent cough for***days.  She also endorses***.  She denies***.  Differential includes*** Plan: -*** -Return precautions discussed with patient  Lurline Del, [place]    This note was prepared using Dragon voice recognition software and may include unintentional dictation errors due to the inherent limitations of voice recognition software.

## 2020-04-27 NOTE — Patient Instructions (Incomplete)
It was great to see you! Thank you for allowing me to participate in your care!  Our plans for today:  - *** -    Take care and seek immediate care sooner if you develop any concerns.   Dr. Lurline Del, [place]

## 2020-04-28 ENCOUNTER — Ambulatory Visit: Payer: 59

## 2020-05-31 ENCOUNTER — Other Ambulatory Visit: Payer: Self-pay

## 2020-05-31 ENCOUNTER — Ambulatory Visit: Payer: 59

## 2020-05-31 ENCOUNTER — Encounter: Payer: 59 | Admitting: Podiatry

## 2020-06-06 ENCOUNTER — Other Ambulatory Visit: Payer: Self-pay | Admitting: Podiatry

## 2020-06-06 ENCOUNTER — Ambulatory Visit (INDEPENDENT_AMBULATORY_CARE_PROVIDER_SITE_OTHER): Payer: 59

## 2020-06-06 ENCOUNTER — Ambulatory Visit (INDEPENDENT_AMBULATORY_CARE_PROVIDER_SITE_OTHER): Payer: 59 | Admitting: Podiatry

## 2020-06-06 ENCOUNTER — Other Ambulatory Visit: Payer: Self-pay

## 2020-06-06 DIAGNOSIS — M25872 Other specified joint disorders, left ankle and foot: Secondary | ICD-10-CM

## 2020-06-06 DIAGNOSIS — M79672 Pain in left foot: Secondary | ICD-10-CM

## 2020-06-06 MED ORDER — METHYLPREDNISOLONE 4 MG PO TBPK: ORAL_TABLET | ORAL | 0 refills | Status: DC

## 2020-06-06 MED ORDER — MELOXICAM 15 MG PO TABS
15.0000 mg | ORAL_TABLET | Freq: Every day | ORAL | 1 refills | Status: DC
Start: 2020-06-06 — End: 2020-09-12

## 2020-06-06 NOTE — Progress Notes (Signed)
   HPI: 55 y.o. female presenting today presenting as a new patient for evaluation of left foot pain underneath the great toe.  She denies a history of injury.  She states it has been painful for her for approximately 3-4 weeks now.  She cannot recall any incident or change in shoe gear that would have elicited the pain.  She has not done anything for treatment.  She presents for further treatment evaluation  Past Medical History:  Diagnosis Date  . Allergy   . Anemia   . Bladder infection   . Fibroids 2012  . History of blood transfusion 1991   unsure of [facility]  . Seasonal allergies   . 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 Sickle cell trait ([place])   . Yeast infection      Physical Exam: General: The patient is alert and oriented x3 in no acute distress.  Dermatology: Skin is warm, dry and supple bilateral lower extremities. Negative for open lesions or macerations.  Vascular: Palpable pedal pulses bilaterally. No edema or erythema noted. Capillary refill within normal limits.  Neurological: Epicritic and protective threshold grossly intact bilaterally.   Musculoskeletal Exam: Range of motion within normal limits to all pedal and ankle joints bilateral. Muscle strength 5/5 in all groups bilateral.  There is some pain on palpation to the sesamoidal apparatus and specifically the tibial sesamoid of the left foot  Radiographic Exam:  Normal osseous mineralization. Joint spaces preserved. No fracture/dislocation/boney destruction.    Assessment: 1.  Sesamoiditis left foot   Plan of Care:  1. Patient evaluated. X-Rays reviewed.  2.  Patient declined injection today 3.  Prescription for Medrol Dosepak 4.  Prescription for meloxicam 15 mg daily after completion of the Dosepak 5.  Postsurgical shoe dispensed.  Weightbearing as tolerated daily 6.  Return to clinic in 3 weeks  *Works in HR      Edrick Kins, [facility]  Dr. Edrick Kins, DPM    [address], [place] [address]                Office [phone]  Fax [phone]

## 2020-06-21 NOTE — Progress Notes (Signed)
This encounter was created in error - please disregard.

## 2020-06-23 ENCOUNTER — Ambulatory Visit (INDEPENDENT_AMBULATORY_CARE_PROVIDER_SITE_OTHER): Payer: 59

## 2020-06-23 ENCOUNTER — Other Ambulatory Visit: Payer: Self-pay

## 2020-06-23 DIAGNOSIS — Z23 Encounter for immunization: Secondary | ICD-10-CM | POA: Diagnosis not present

## 2020-07-04 ENCOUNTER — Other Ambulatory Visit: Payer: Self-pay

## 2020-07-04 ENCOUNTER — Ambulatory Visit: Payer: 59 | Admitting: Podiatry

## 2020-07-04 DIAGNOSIS — M25872 Other specified joint disorders, left ankle and foot: Secondary | ICD-10-CM | POA: Diagnosis not present

## 2020-07-20 NOTE — Progress Notes (Signed)
   HPI: 55 y.o. female presenting today for follow-up evaluation of sesamoiditis to the left foot.  She states that overall she is feeling better.  She continues to have some mild discomfort but overall improvement.  She has been weightbearing in the postsurgical shoe and taking meloxicam as instructed.  Past Medical History:  Diagnosis Date  . Allergy   . Anemia   . Bladder infection   . Fibroids 2012  . History of blood transfusion 1991   uns[facility]  . Seasonal allergies   . 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 Sickle cell trait ([place])   . Yeast infection      Physical Exam: General: The patient is alert and oriented x3 in no acute distress.  Dermatology: Skin is warm, dry and supple bilateral lower extremities. Negative for open lesions or macerations.  Vascular: Palpable pedal pulses bilaterally. No edema or erythema noted. Capillary refill within normal limits.  Neurological: Epicritic and protective threshold grossly intact bilaterally.   Musculoskeletal Exam: Range of motion within normal limits to all pedal and ankle joints bilateral. Muscle strength 5/5 in all groups bilateral.  There is some pain on palpation to the sesamoidal apparatus and specifically the tibial sesamoid of the left foot  Radiographic Exam:  Normal osseous mineralization. Joint spaces preserved. No fracture/dislocation/boney destruction.    Assessment: 1.  Sesamoiditis left foot   Plan of Care:  1. Patient evaluated. X-Rays reviewed.  2.  Patient declined injection today 3.  Continue meloxicam 15 mg daily as needed  5.  Continue postsurgical shoe as needed.  Weightbearing as tolerated 6.  Return to clinic in 2 months  *Works in [place] [employer]  & Mitzi Davenport, DPM [facility]  Dr. Edrick Kins, DPM    [address], [place] [address]                Office [phone]  Fax  [phone]

## 2020-09-12 ENCOUNTER — Other Ambulatory Visit: Payer: Self-pay | Admitting: Podiatry

## 2020-10-04 ENCOUNTER — Other Ambulatory Visit: Payer: Self-pay

## 2020-10-04 ENCOUNTER — Ambulatory Visit (INDEPENDENT_AMBULATORY_CARE_PROVIDER_SITE_OTHER): Payer: 59 | Admitting: Family Medicine

## 2020-10-04 DIAGNOSIS — R635 Abnormal weight gain: Secondary | ICD-10-CM | POA: Diagnosis not present

## 2020-10-04 DIAGNOSIS — R430 Anosmia: Secondary | ICD-10-CM | POA: Diagnosis not present

## 2020-10-04 NOTE — Progress Notes (Addendum)
    SUBJECTIVE:   CHIEF COMPLAINT / HPI:   Anosmia Still can smell or taste very little. Perhaps slowly improving  Weight Gain Thinks was doing better until several weeks ago when hurt her back and couldn't do her regular exercise.  Does not drink sweet drinks.  Does eat pasta frequently    PERTINENT  PMH / PSH: Covid 2 years ago  OBJECTIVE:   BP 138/80   Pulse 84   Ht '5\' 3"'$  (1.6 m)   Wt 188 lb (85.3 kg)   LMP 12/14/2011   SpO2 99%   BMI 33.30 kg/m   Heart - Regular rate and rhythm.  No murmurs, gallops or rubs.    Lungs:  Normal respiratory effort, chest expands symmetrically. Lungs are clear to auscultation, no crackles or wheezes. Abdomen: soft and non-tender without masses, organomegaly or hernias noted.  No guarding or rebound Extremities:  No cyanosis, edema, or deformity noted with good range of motion of all major joints.   Skin - one dark spot on L chest approx 1 cm in diameter dark.  No changing according to her   ASSESSMENT/PLAN:   Anosmia Unchanged.  She does not wish any further work up or treatment right now  Weight gain Improved.  Continue regular exercise.  Discussed dietary changes    Annual Examination Female over 64 yo  I reviewed the following patient responses on our Physical Exam Form Tobacco use and candidacy for lung cancer screening Alcohol Use - rare  Weight  Exercise  Risk for STI  Fall risk Advanced Directive Increased family cancer risk Violence risk  PHQ9 score reviewed  Blood pressure reviewed  I considered the following items based upon USPSTF recommendations: Cervical Cancer if female at birth - will see her gyne Mammogram- reminded Colon cancer screening Osteoporosis screening HIV testing:  Hepatitis C testing Cholesterol screening STI screening if high risk (Hepatitis B, Syphilis, Gonorrhea, Chlamydia) Immunizations - Influenza, Covid, Shingle, Pneumonia, Tetanus - she will consider Shingles  See After Visit Summary  for recommendations    Lind Covert, MD [place]

## 2020-10-04 NOTE — Assessment & Plan Note (Signed)
Improved.  Continue regular exercise.  Discussed dietary changes

## 2020-10-04 NOTE — Patient Instructions (Signed)
Good to see you today - Thank you for coming in  Things we discussed today:  Get back to exercise  Pap Smear - see your gynecologist  You need a mammogram to prevent breast cancer.  Please schedule an appointment.  You can call [phone].     Think about the shingles vaccine   Please always bring your medication bottles  Come back to see me in 1 year

## 2020-10-04 NOTE — Assessment & Plan Note (Signed)
Unchanged.  She does not wish any further work up or treatment right now

## 2020-10-05 ENCOUNTER — Ambulatory Visit: Payer: 59 | Admitting: Podiatry

## 2020-10-05 DIAGNOSIS — M25872 Other specified joint disorders, left ankle and foot: Secondary | ICD-10-CM

## 2020-10-05 NOTE — Progress Notes (Signed)
   HPI: 55 y.o. female presenting today for follow-up evaluation of sesamoiditis to the left foot.  Patient is doing very well.  Patient states that she has no pain or tenderness to the plantar aspect of the great toe.  Overall there is significant improvement.  Patient has only been taking the meloxicam as needed and wearing good shoes.  No new complaints at this time  Past Medical History:  Diagnosis Date   Allergy    Anemia    Bladder infection    Fibroids 2012   History of blood transfusion 1991   [facility]   Seasonal allergies    Sickle cell anemia ([place])    TRAIT   Sickle cell trait ([place])    Yeast infection      Physical Exam: General: The patient is alert and oriented x3 in no acute distress.  Dermatology: Skin is warm, dry and supple bilateral lower extremities. Negative for open lesions or macerations.  Vascular: Palpable pedal pulses bilaterally. No edema or erythema noted. Capillary refill within normal limits.  Neurological: Epicritic and protective threshold grossly intact bilaterally.   Musculoskeletal Exam: Range of motion within normal limits to all pedal and ankle joints bilateral. Muscle strength 5/5 in all groups bilateral.  Currently there is negative pain on palpation to the sesamoidal apparatus and specifically the tibial sesamoid of the left foot   Assessment: 1.  Sesamoiditis left foot; resolved   Plan of Care:  1. Patient evaluated.  2.  Overall the patient is doing much better.  Continue meloxicam 15 mg daily as needed 3.  Continue wearing good supportive shoes and sneakers 4.  Return to clinic as needed  *Works in [place] [employer]  & Mitzi Davenport, DPM [facility]  Dr. Edrick Kins, DPM    [address], [place] [address]                Office [phone]  Fax [phone]

## 2020-10-18 ENCOUNTER — Telehealth (INDEPENDENT_AMBULATORY_CARE_PROVIDER_SITE_OTHER): Payer: 59 | Admitting: Family Medicine

## 2020-10-18 ENCOUNTER — Other Ambulatory Visit: Payer: Self-pay

## 2020-10-18 VITALS — Ht 63.0 in | Wt 188.0 lb

## 2020-10-18 DIAGNOSIS — B349 Viral infection, unspecified: Secondary | ICD-10-CM | POA: Diagnosis not present

## 2020-10-18 NOTE — Progress Notes (Signed)
Virtual Visit via Telephone Note  I connected with Sheri Shepard on 10/18/20 at  2:45 PM EDT by telephone and verified that I am speaking with the correct person using two identifiers. Unable to do video visit due to technically difficulties.   Location: Patient: Sheri Shepard Provider: Donney Dice, DO   I discussed the limitations, risks, security and privacy concerns of performing an evaluation and management service by telephone and the availability of in person appointments. I also discussed with the patient that there may be a patient responsible charge related to this service. The patient expressed understanding and agreed to proceed.   History of Present Illness: Patient endorsing chills that started about 4 days ago which is also when she began to experience non-bloody diarrhea, myalgias, non-bloody emesis and poor oral intake. Shortly after began to experience abdominal pain. Her diarrhea has improved from 3-4 episodes a day to now twice. She noted to feel warm but was not able to check her temperature. Was not able to keep any food down until about a day ago. She is having no difficulty with maintaining adequate fluid intake with ginger ale and water. Her productive cough has improved as well and is now appearing clear. Denies dyspnea. Sick contacts include her co-workers. She has not tested for COVID. She has previously received bother COVID vaccinations.      Observations/Objective: Resp: normal work of breathing, no evident signs of respiratory distress Psych: mood appropriate  Assessment and Plan: Likely due to viral illness, cannot exclude COVID. Recommended to get COVID testing to determine when she can go back to work. Discussed supportive care including honey and plenty of rest and fluids. Strict return precautions discussed.   Follow Up Instructions: Return precautions discussed. Follow up as appropriate.    I discussed the assessment and treatment plan with the  patient. The patient was provided an opportunity to ask questions and all were answered. The patient agreed with the plan and demonstrated an understanding of the instructions.   The patient was advised to call back or seek an in-person evaluation if the symptoms worsen or if the condition fails to improve as anticipated.  I provided 5 minutes of non-face-to-face time during this encounter.   Donney Dice, DO

## 2021-05-08 ENCOUNTER — Encounter: Payer: Self-pay | Admitting: Student

## 2021-05-08 ENCOUNTER — Other Ambulatory Visit: Payer: Self-pay

## 2021-05-08 ENCOUNTER — Ambulatory Visit: Payer: 59 | Admitting: Student

## 2021-05-08 VITALS — BP 144/88 | HR 79 | Wt 195.8 lb

## 2021-05-08 DIAGNOSIS — R21 Rash and other nonspecific skin eruption: Secondary | ICD-10-CM | POA: Diagnosis not present

## 2021-05-08 LAB — POCT GLYCOSYLATED HEMOGLOBIN (HGB A1C): Hemoglobin A1C: 5.8 % — AB (ref 4.0–5.6)

## 2021-05-08 NOTE — Progress Notes (Signed)
?  SUBJECTIVE:  ? ?CHIEF COMPLAINT / HPI:  ? ?Dark spot of face, sometimes it lightens up and sometimes it gets darker. She's had it for a year. Has tried Hydrocortisone, coco butter, alovera. Wanting derm refferal if we can't figure it out today. Never itches or burns or bother's her. Looks bigger when it's darker. Everything she's tried has made it darker. Also appreciates that she had Covid in 2020, and may have noticed it then. Only on vitamins, meloxicam and lantanprost. Hasn't noticed any difference with sun light. Picture taken and placed in media tab. ? ? ? ?PERTINENT  PMH / PSH:  ? ?Past Medical History:  ?Diagnosis Date  ? Allergy   ? Anemia   ? Bladder infection   ? Fibroids 2012  ? History of blood transfusion 1991  ? unsure [facility]  ? Seasonal allergies   ? Sickle cell anemia (HCC)   ? TRAIT  ? Sickle cell trait ([place])   ? Yeast infection   ? ? ?Past Surgical History:  ?Procedure Laterality Date  ? ABDOMINAL HYSTERECTOMY    ? CESAREAN SECTION  [phone]  ? x 3  ? EYE MUSCLE SURGERY    ? ROBOTIC ASSISTED TOTAL HYSTERECTOMY Bilateral 07/10/2012  ? Procedure: ROBOTIC ASSISTED TOTAL HYSTERECTOMY WITH BILATERAL SALPINGECTOMY;  Surgeon: Alwyn Pea, MD;  Location: [facility];  Service: Gynecology;  Laterality: Bilateral;  ? TONSILLECTOMY AND ADENOIDECTOMY    ? TUBAL LIGATION    ? WISDOM TOOTH EXTRACTION    ? ? ?OBJECTIVE:  ?BP (!) 144/88   Pulse 79   Wt 195 lb 12.8 oz (88.8 kg)   LMP 12/14/2011   SpO2 99%   BMI 34.68 kg/m?  ? ?General: NAD, pleasant, able to participate in exam ?Cardiac: RRR, no murmurs auscultated. ?Respiratory: CTAB, normal effort, no wheezes, rales or rhonchi ?Abdomen: soft, non-tender, non-distended, normoactive bowel sounds ?Extremities: warm and well perfused, no edema or cyanosis. ?Skin: warm and dry, notable darkening of skin, non-velvet like, over left cheek, no dry skin, no erythema, no irritation. No acanthosis nigricans noted on neck, or  elbow crease.  ?Neuro: alert, no obvious focal deficits, speech normal ?Psych: Normal affect and mood ? ?ASSESSMENT/PLAN:  ?Rash of face ?Rash noted for year or longer on left cheek of patient. Is darker in appearance and hasn't grown, since noticing. Rash looks like insulin resistance, patient BMI 34.68 with a family history of diabetes. Will obtain Hgb A1c to screen for insulin resistance/diabetes. No concerns for dry skin, irritated skin, dermatitis. Rash is not caused by/causing irritation. Will also make referral to dermatology. Patient not taking many medicine, and skin care products have not helped. This is unlikely to be drug reaction or reaction from skin care products.  ?-Hgb A1c ?-Referral to Dermatology ?  ?Orders Placed This Encounter  ?Procedures  ? Ambulatory referral to Dermatology  ?  Referral Priority:   Routine  ?  Referral Type:   Consultation  ?  Referral Reason:   Specialty Services Required  ?  Requested Specialty:   Dermatology  ?  Number of Visits Requested:   1  ? HgB A1c  ? ?No orders of the defined types were placed in this encounter. ? ?No follow-ups on file. ?'@[facility]'$ @ ? ?

## 2021-05-08 NOTE — Patient Instructions (Signed)
It was great to see you! Thank you for allowing me to participate in your care! You were seen for a rash on your left cheek. This may be related to insulin levels, so we will check you for diabetes. We will also make a referral to dermatology ? ?Our plans for today:  ?- Check Hgb A1c to see if this rash is linked to diabetes ?- Referral to dermatology ? ?We are checking some labs today, I will call you if they are abnormal will send you a MyChart message or a letter if they are normal.  If you do not hear about your labs in the next 2 weeks please let us know. ? ?Take care and seek immediate care sooner if you develop any concerns.  ? ?Dr. Holley Bouche, MD ?[place] ? ?

## 2021-05-08 NOTE — Assessment & Plan Note (Addendum)
Rash noted for year or longer on left cheek of patient. Is darker in appearance and hasn't grown, since noticing. Rash looks like insulin resistance, patient BMI 34.68 with a family history of diabetes. Will obtain Hgb A1c to screen for insulin resistance/diabetes. No concerns for dry skin, irritated skin, dermatitis. Rash is not caused by/causing irritation. Will also make referral to dermatology. Patient not taking many medicine, and skin care products have not helped. This is unlikely to be drug reaction or reaction from skin care products.  ?-Hgb A1c ?-Referral to Dermatology ?

## 2021-05-23 ENCOUNTER — Encounter: Payer: Self-pay | Admitting: *Deleted

## 2021-06-02 ENCOUNTER — Other Ambulatory Visit: Payer: Self-pay | Admitting: Podiatry

## 2021-06-26 ENCOUNTER — Ambulatory Visit: Payer: 59 | Admitting: Podiatry

## 2021-07-12 ENCOUNTER — Ambulatory Visit (INDEPENDENT_AMBULATORY_CARE_PROVIDER_SITE_OTHER): Payer: 59

## 2021-07-12 ENCOUNTER — Ambulatory Visit: Payer: 59 | Admitting: Podiatry

## 2021-07-12 ENCOUNTER — Encounter: Payer: Self-pay | Admitting: Podiatry

## 2021-07-12 DIAGNOSIS — M7751 Other enthesopathy of right foot: Secondary | ICD-10-CM | POA: Diagnosis not present

## 2021-07-12 MED ORDER — METHYLPREDNISOLONE 4 MG PO TBPK: ORAL_TABLET | ORAL | 0 refills | Status: DC

## 2021-07-12 NOTE — Progress Notes (Signed)
? ?  Subjective:  ?56 y.o. female presenting today for evaluation of right ankle injury that began about 3 weeks ago when she twisted her ankle.  Patient states that she has had pain and tenderness ever since.  She says that now the ankle pain is intermittent and comes and goes and has improved over the past week or so.  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  Recent flareup.  She denies a history of recent injury to this foot. ? ? ?Past Medical History:  ?Diagnosis Date  ? Allergy   ? Anemia   ? Bladder infection   ? Fibroids 2012  ? History of blood transfusion 1991  ? unsure [facility]  ? Seasonal allergies   ? Sickle cell anemia (HCC)   ? TRAIT  ? Sickle cell trait ([place])   ? Yeast infection   ? ?Past Surgical History:  ?Procedure Laterality Date  ? ABDOMINAL HYSTERECTOMY    ? CESAREAN SECTION  [phone]  ? x 3  ? EYE MUSCLE SURGERY    ? ROBOTIC ASSISTED TOTAL HYSTERECTOMY Bilateral 07/10/2012  ? Procedure: ROBOTIC ASSISTED TOTAL HYSTERECTOMY WITH BILATERAL SALPINGECTOMY;  Surgeon: Alwyn Pea, MD;  Location: [facility];  Service: Gynecology;  Laterality: Bilateral;  ? TONSILLECTOMY AND ADENOIDECTOMY    ? TUBAL LIGATION    ? WISDOM TOOTH EXTRACTION    ? ?Allergies  ?Allergen Reactions  ? Tramadol Hcl Shortness Of Breath and Itching  ? Ciprofloxacin Itching  ? Hydrocodone Itching  ? Penicillins Hives and Itching  ? Propoxyphene Itching  ? Propoxyphene N-Acetaminophen Itching  ? ? ? ?Objective / Physical Exam:  ?General:  ?The patient is alert and oriented x3 in no acute distress. ?Dermatology:  ?Skin is warm, dry and supple bilateral lower extremities. Negative for open lesions or macerations. ?Vascular:  ?Palpable pedal pulses bilaterally. No edema or erythema noted. Capillary refill within normal limits. ?Neurological:  ?Epicritic and protective threshold grossly intact bilaterally.  ?Musculoskeletal Exam:  ?Pain on palpation to the  lateral aspect of the patient's right ankle. Mild edema noted. Range of motion within normal limits to all pedal and ankle joints bilateral. Muscle strength 5/5 in all groups bilateral.  There is also some pain on palpation to the sesamoid apparatus left foot.  Mild tenderness to palpation and range of motion as well to the first MTP ? ?Radiographic Exam:  ?Normal osseous mineralization. Joint spaces preserved. No fracture/dislocation/boney destruction.   ? ?Assessment: ?1.  Capsulitis/ankle sprain RT ?2.  First MTP sesamoiditis left ? ?Plan of Care:  ?1. Patient was evaluated. X-Rays reviewed.  ?2.  Patient declined steroid injections ?3.  Patient has meloxicam 15 mg at home.  Prescription for Medrol Dosepak and then resume the meloxicam ?4.  Compression ankle sleeve dispensed.  Wear daily right ankle ?5.  Return to clinic as needed ? ?Edrick Kins, DPM ?[place] ? ?Dr. Edrick Kins, DPM  ?  ?[facility]                                        ?[place], [place] [address]                ?Office [phone]  ?Fax [phone] ? ?

## 2021-08-01 ENCOUNTER — Encounter: Payer: Self-pay | Admitting: *Deleted

## 2021-08-30 ENCOUNTER — Ambulatory Visit (INDEPENDENT_AMBULATORY_CARE_PROVIDER_SITE_OTHER): Payer: 59 | Admitting: Family Medicine

## 2021-08-30 DIAGNOSIS — L819 Disorder of pigmentation, unspecified: Secondary | ICD-10-CM | POA: Diagnosis not present

## 2021-08-30 DIAGNOSIS — R609 Edema, unspecified: Secondary | ICD-10-CM

## 2021-08-30 NOTE — Assessment & Plan Note (Addendum)
I think her ankle swelling is mostly lipidedema not true edema.  No signs of systemic disease.   Will check cbc cmet and monitor.

## 2021-08-30 NOTE — Assessment & Plan Note (Signed)
Likely benign but given location and history suggest removal - will make an appointment

## 2021-08-30 NOTE — Patient Instructions (Signed)
Good to see you today - Thank you for coming in  Things we discussed today:  Swelling - will check lab test and let you know I will call you if your tests are not good.  Otherwise, I will send you a message on MyChart (if it is active) or a letter in the mail..  If you do not hear from me with in 2 weeks please call our office.     Cut back on sweets and salt   Let me know if getting worse or any shortness of breath or chest pain or change in urine   Make an appointment with our derm clinic - faculty for mole removal   Ask Dr Matthew Saras to send [place] a report of your pap smear

## 2021-08-30 NOTE — Progress Notes (Signed)
    SUBJECTIVE:   CHIEF COMPLAINT / HPI:   Ankle Swelling Over the last few months.  Does not change much with time of day.  Does not go up her legs.  Has actually lost about 5 lbs from last visit.  No shortness of breath or chest pain or orthopnea or change in urine.  Thinks might have had in the summer last year.  Would like to continue to lose weight    Anosmia Post covid.  Still can smell or taste very little. Perhaps slowly improving   PERTINENT  PMH / PSH: now working some as Physical Therapy which keeps her more active  OBJECTIVE:   BP (!) 131/58   Pulse 84   Wt 190 lb 6.4 oz (86.4 kg)   LMP 12/14/2011   SpO2 100%   BMI 33.73 kg/m   Legs - no pitting edema.  Ankles do look slightly large.  No skin changes or calf swelling or tenderness.  FROM with normal sensation and perfusion Heart - Regular rate and rhythm.  No murmurs, gallops or rubs.    Lungs:  Normal respiratory effort, chest expands symmetrically. Lungs are clear to auscultation, no crackles or wheezes. Abdomen: soft and non-tender without masses, organomegaly or hernias noted.  No guarding or rebound  Has dark nevus on L chest wall - would like removed   ASSESSMENT/PLAN:   Edema I think her ankle swelling is mostly lipidedema not true edema.  No signs of systemic disease.   Will check cbc cmet and monitor.      Pigmented skin lesion Likely benign but given location and history suggest removal - will make an appointment      Lind Covert, [place]

## 2021-08-31 ENCOUNTER — Ambulatory Visit (INDEPENDENT_AMBULATORY_CARE_PROVIDER_SITE_OTHER): Payer: 59 | Admitting: Family Medicine

## 2021-08-31 VITALS — BP 121/75 | HR 88 | Wt 190.4 lb

## 2021-08-31 DIAGNOSIS — D229 Melanocytic nevi, unspecified: Secondary | ICD-10-CM | POA: Diagnosis not present

## 2021-08-31 DIAGNOSIS — L819 Disorder of pigmentation, unspecified: Secondary | ICD-10-CM

## 2021-08-31 LAB — CMP14+EGFR
ALT: 19 IU/L (ref 0–32)
AST: 16 IU/L (ref 0–40)
Albumin/Globulin Ratio: 1.5 (ref 1.2–2.2)
Albumin: 4.3 g/dL (ref 3.8–4.9)
Alkaline Phosphatase: 109 IU/L (ref 44–121)
BUN/Creatinine Ratio: 12 (ref 9–23)
BUN: 10 mg/dL (ref 6–24)
Bilirubin Total: 0.3 mg/dL (ref 0.0–1.2)
CO2: 25 mmol/L (ref 20–29)
Calcium: 9.8 mg/dL (ref 8.7–10.2)
Chloride: 103 mmol/L (ref 96–106)
Creatinine, Ser: 0.82 mg/dL (ref 0.57–1.00)
Globulin, Total: 2.8 g/dL (ref 1.5–4.5)
Glucose: 102 mg/dL — ABNORMAL HIGH (ref 70–99)
Potassium: 4.5 mmol/L (ref 3.5–5.2)
Sodium: 139 mmol/L (ref 134–144)
Total Protein: 7.1 g/dL (ref 6.0–8.5)
eGFR: 85 mL/min/{1.73_m2} (ref >59)

## 2021-08-31 LAB — CBC
Hematocrit: 40.7 % (ref 34.0–46.6)
Hemoglobin: 13.4 g/dL (ref 11.1–15.9)
MCH: 28.5 pg (ref 26.6–33.0)
MCHC: 32.9 g/dL (ref 31.5–35.7)
MCV: 86 fL (ref 79–97)
Platelets: 333 10*3/uL (ref 150–450)
RBC: 4.71 x10E6/uL (ref 3.77–5.28)
RDW: 13.2 % (ref 11.7–15.4)
WBC: 5 10*3/uL (ref 3.4–10.8)

## 2021-08-31 NOTE — Patient Instructions (Signed)

## 2021-08-31 NOTE — Progress Notes (Signed)
    SUBJECTIVE:   Sheri Shepard / HPI:    Patient presenting for mole removal.  Patient states that she has had the mole since childhood.  It has not changed in size.  It does occasionally itch and is irritating especially against the seatbelt.  She would like this removed.   PERTINENT  PMH / PSH: no history of skin cancer  Patient Care Team: Lind Covert, MD as PCP - General   OBJECTIVE:   BP 121/75   Pulse 88   Wt 190 lb 6.4 oz (86.4 kg)   LMP 12/14/2011   SpO2 98%   BMI 33.73 kg/m   Physical Exam Constitutional:      General: She is not in acute distress. Cardiovascular:     Rate and Rhythm: Normal rate.  Pulmonary:     Effort: Pulmonary effort is normal.  Musculoskeletal:     Cervical back: Neck supple.  Skin:    Comments: Approximately 0.5 cm x 0.7 cm hyperpigmented raised lesion with a stuck on appearance on the left upper chest.  Neurological:     Mental Status: She is alert.           Pre-op Diagnosis: hyperpigmented skin lesion Post-op Diagnosis: Same Procedure: Shave Skin Biopsy Location: left upper chest Performing Physician: Zola Button, MD  Informed consent was obtained prior to the procedure. Time out performed. The area surrounding the skin lesion was prepared and cleaned with povidone-iodine swab stick and wiped clean with alcohol prep. The area was sufficiently anesthetized with 1% Lidocaine with epinephrine. A sterile disposable DermaBlade was used to obtain tissue sample and placed in labeled biopsy cup. Silver nitrate was used to obtain hemostasis. The patient tolerated the procedure well without complications.  Standard post-procedure care was explained and return precautions and wound care handout given.   {Show previous vital signs (optional):23777}    ASSESSMENT/PLAN:   Pigmented skin lesion Most likely pigmented SK. Unlikely malignancy. Lesion shaved as above, sent for pathology.    Return if  symptoms worsen or fail to improve.   Zola Button, MD [place]

## 2021-08-31 NOTE — Assessment & Plan Note (Signed)
Most likely pigmented SK. Unlikely malignancy. Lesion shaved as above, sent for pathology.

## 2021-10-11 ENCOUNTER — Ambulatory Visit (INDEPENDENT_AMBULATORY_CARE_PROVIDER_SITE_OTHER): Payer: 59 | Admitting: Family Medicine

## 2021-10-11 ENCOUNTER — Encounter: Payer: Self-pay | Admitting: Family Medicine

## 2021-10-11 ENCOUNTER — Other Ambulatory Visit: Payer: Self-pay

## 2021-10-11 DIAGNOSIS — R635 Abnormal weight gain: Secondary | ICD-10-CM

## 2021-10-11 DIAGNOSIS — R609 Edema, unspecified: Secondary | ICD-10-CM

## 2021-10-11 NOTE — Progress Notes (Signed)
SUBJECTIVE:   CHIEF COMPLAINT / HPI:   Weight Gain - working on diet and exercise  Edema - certainly not worse and maybe better.  No shortness of breath or chest pain or orthopnea  Mole was removed - healing slowly  PERTINENT  PMH / PSH: Recent cbc and cmet normal   The 10-year ASCVD risk score (Arnett DK, et al., 2019) is: 4%   Values used to calculate the score:     Age: 56 years     Sex: Female     Is Non-Hispanic African American: Yes     Diabetic: No     Tobacco smoker: No     Systolic Blood Pressure: 427 mmHg     Is BP treated: No     HDL Cholesterol: 40 mg/dL     Total Cholesterol: 186 mg/dL  OBJECTIVE:   BP 132/76   Pulse 87   Wt 192 lb (87.1 kg)   LMP 12/14/2011   SpO2 97%   BMI 34.01 kg/m   Neck:  No deformities, thyromegaly, masses, or tenderness noted.   Supple with full range of motion without pain. Heart - Regular rate and rhythm.  No murmurs, gallops or rubs.    Lungs:  Normal respiratory effort, chest expands symmetrically. Lungs are clear to auscultation, no crackles or wheezes. Abdomen: soft and non-tender without masses, organomegaly or hernias noted.  No guarding or rebound Extremities:  No cyanosis, no edema, or deformity noted with good range of motion of all major joints.    Mobility:able to get up and down from exam table without assistance or distress Mole removal site on L chest - healing    ASSESSMENT/PLAN:   Edema None on exam today.  Labs unremarkable.  Consistent with mild venous insuff and weight.  Recommend working on weight and elevation  Weight gain Not improved.   Discussed importance of diet    Annual Examination Female over 16 yo  I reviewed the following patient responses on our Physical Exam Form Tobacco use and candidacy for lung cancer screening Alcohol Use  Weight  Exercise  Risk for STI  Fall risk Advanced Directive Increased family cancer risk Violence risk  PHQ9 score reviewed  Blood pressure  reviewed  I considered the following items based upon USPSTF recommendations: Cervical Cancer if female at birth 79 Colon cancer screening Osteoporosis screening HIV testing:  Hepatitis C testing Cholesterol screening STI screening if high risk (Hepatitis B, Syphilis, Gonorrhea, Chlamydia) Immunizations - Influenza, Covid, Shingle, Pneumonia, Tetanus  See After Visit Summary for recommendations   Patient Instructions  Good to see you today - Thank you for coming in  Things we discussed today:  You need to Improve your Weight Drink water and other unsweetened drinks like coffee, milk, tea- No sweet drinks Eat smaller portions of starchy and sweet foods like rice, potatoes, wheat, corn, or fruits.  Eat twice as many vegetables  Exercise at least 30 minutes every day Aim to loose about 1-2 lbs a week  If you wish to discuss weight loss further, please let me know or make an appointment   As Dr Matthew Saras of your pap smear  Consider the Zoster vaccines to help prevent Shingles.  The shot may cause a sore arm and mild flu like symptoms for a few days.  You will need a second shot 2 months after your first.     Come back to see me in one year    Lind Covert, MD  [place]

## 2021-10-11 NOTE — Assessment & Plan Note (Signed)
Not improved.   Discussed importance of diet

## 2021-10-11 NOTE — Patient Instructions (Addendum)
Good to see you today - Thank you for coming in  Things we discussed today:  You need to Improve your Weight Drink water and other unsweetened drinks like coffee, milk, tea- No sweet drinks Eat smaller portions of starchy and sweet foods like rice, potatoes, wheat, corn, or fruits.  Eat twice as many vegetables  Exercise at least 30 minutes every day Aim to loose about 1-2 lbs a week  If you wish to discuss weight loss further, please let me know or make an appointment   As Dr Matthew Saras of your pap smear  Consider the Zoster vaccines to help prevent Shingles.  The shot may cause a sore arm and mild flu like symptoms for a few days.  You will need a second shot 2 months after your first.     Come back to see me in one year

## 2021-10-11 NOTE — Assessment & Plan Note (Signed)
None on exam today.  Labs unremarkable.  Consistent with mild venous insuff and weight.  Recommend working on weight and elevation

## 2021-12-25 ENCOUNTER — Telehealth: Payer: Self-pay | Admitting: *Deleted

## 2022-01-01 NOTE — Telephone Encounter (Signed)
error 

## 2022-12-04 ENCOUNTER — Encounter: Payer: Self-pay | Admitting: Family Medicine

## 2022-12-04 ENCOUNTER — Other Ambulatory Visit: Payer: Self-pay

## 2022-12-04 ENCOUNTER — Ambulatory Visit: Payer: 59 | Admitting: Family Medicine

## 2022-12-04 VITALS — BP 125/84 | HR 84 | Ht 63.0 in | Wt 194.2 lb

## 2022-12-04 DIAGNOSIS — R635 Abnormal weight gain: Secondary | ICD-10-CM | POA: Diagnosis not present

## 2022-12-04 NOTE — Progress Notes (Signed)
    SUBJECTIVE:   CHIEF COMPLAINT / HPI:   Weight She is concerned that she is not losing weight.   Tries to watch her diet and exercise.  Eats only one meal a day Wt Readings from Last 3 Encounters:  12/04/22 194 lb 3.2 oz (88.1 kg)  10/11/21 192 lb (87.1 kg)  08/31/21 190 lb 6.4 oz (86.4 kg)   Sense of smell is still decreased after Covid several years ago  OBJECTIVE:   BP 125/84   Pulse 84   Ht 5\' 3"  (1.6 m)   Wt 194 lb 3.2 oz (88.1 kg)   LMP 12/14/2011   SpO2 99%   BMI 34.40 kg/m   Heart - Regular rate and rhythm.  No murmurs, gallops or rubs.    Lungs:  Normal respiratory effort, chest expands symmetrically. Lungs are clear to auscultation, no crackles or wheezes. Abdomen: soft and non-tender without masses, organomegaly or hernias noted.  No guarding or rebound Extremities:  No cyanosis, edema, or deformity noted with good range of motion of all major joints.   Neck:  No deformities, thyromegaly, masses, or tenderness noted.   Supple with full range of motion without pain. Mobility:able to get up and down from exam table without assistance or distress   ASSESSMENT/PLAN:   Weight gain Assessment & Plan: Stable but higher than she would like.  Discussed strategies see after visit summary    Annual Examination Female over 13 yo  I reviewed the following patient responses on our Physical Exam Form Tobacco use and candidacy for lung cancer screening  Alcohol Use  Weight  Exercise  Risk for STI  Fall risk Advanced Directive Increased family cancer risk Violence risk  PHQ9 score reviewed  Blood pressure reviewed  I considered the following items based upon USPSTF recommendations: Cervical Cancer if female at birth Mammogram - had already will get records Colon cancer screening Osteoporosis screening HIV testing:  Hepatitis C testing Cholesterol screening STI screening if high risk (Hepatitis B, Syphilis, Gonorrhea, Chlamydia) Immunizations - Influenza,  Covid, Shingle, Pneumonia, Tetanus  See After Visit Summary for recommendations   Patient Instructions  Good to see you today - Thank you for coming in  Things we discussed today:  Ideas to improve your weight Drink water and other unsweetened drinks like coffee, milk, tea- No sweet drinks Eat smaller portions of starchy and sweet foods like rice, potatoes, wheat, corn, or fruits.  Eat twice as many vegetables  Exercise at least 30 minutes every day If you wish to discuss weight loss further, please let me know or make an appointment   Consider flu and shingles immunizations to prevent these illnesses  Take a calcium supplement daily or every other day   Come back for a physical in one year    Carney Living, MD [facility]

## 2022-12-04 NOTE — Patient Instructions (Addendum)
Good to see you today - Thank you for coming in  Things we discussed today:  Ideas to improve your weight Drink water and other unsweetened drinks like coffee, milk, tea- No sweet drinks Eat smaller portions of starchy and sweet foods like rice, potatoes, wheat, corn, or fruits.  Eat twice as many vegetables  Exercise at least 30 minutes every day If you wish to discuss weight loss further, please let me know or make an appointment   Consider flu and shingles immunizations to prevent these illnesses  Take a calcium supplement daily or every other day   Come back for a physical in one year

## 2022-12-04 NOTE — Assessment & Plan Note (Signed)
Stable but higher than she would like.  Discussed strategies see after visit summary

## 2023-01-15 ENCOUNTER — Ambulatory Visit: Payer: 59 | Admitting: Family

## 2023-01-15 ENCOUNTER — Ambulatory Visit (HOSPITAL_BASED_OUTPATIENT_CLINIC_OR_DEPARTMENT_OTHER)
Admission: RE | Admit: 2023-01-15 | Discharge: 2023-01-15 | Disposition: A | Discharge status: Home / Self Care | Payer: 59 | Source: Ambulatory Visit | Attending: Family | Admitting: Family

## 2023-01-15 ENCOUNTER — Encounter: Payer: Self-pay | Admitting: Family

## 2023-01-15 VITALS — BP 118/82 | HR 91 | Ht 63.0 in | Wt 197.6 lb

## 2023-01-15 DIAGNOSIS — Z6835 Body mass index (BMI) 35.0-35.9, adult: Secondary | ICD-10-CM

## 2023-01-15 DIAGNOSIS — R350 Frequency of micturition: Secondary | ICD-10-CM | POA: Diagnosis not present

## 2023-01-15 DIAGNOSIS — R7303 Prediabetes: Secondary | ICD-10-CM | POA: Diagnosis not present

## 2023-01-15 DIAGNOSIS — M25552 Pain in left hip: Secondary | ICD-10-CM | POA: Diagnosis not present

## 2023-01-15 DIAGNOSIS — M5442 Lumbago with sciatica, left side: Secondary | ICD-10-CM | POA: Insufficient documentation

## 2023-01-15 DIAGNOSIS — H409 Unspecified glaucoma: Secondary | ICD-10-CM | POA: Insufficient documentation

## 2023-01-15 NOTE — Progress Notes (Signed)
Sheri Shepard is a 57 y.o. female with the following history as recorded in Epic[facility]:  Patient Active Problem List   Diagnosis Date Noted   Glaucoma 01/15/2023   Anosmia 08/26/2018   Rhinitis, chronic 08/26/2018   Reflux gastritis 08/05/2018   Weight gain 07/28/2014   Sickle cell trait (HCC)     Current Outpatient Medications  Medication Sig Dispense Refill   latanoprost (XALATAN) 0.005 % ophthalmic solution latanoprost 0.005 % eye drops  INSTILL 1 DROP INTO IN AFFECTED EYE(S) ONCE DAILY IN THE EVENING     No current facility-administered medications for this visit.    Allergies: Tramadol hcl, Ciprofloxacin, Hydrocodone, Penicillins, Propoxyphene, and Propoxyphene n-acetaminophen  Past Medical History:  Diagnosis Date   Allergy    Anemia    Bladder infection    Fibroids 2012   History of blood transfusion 1991   unsure [facility]   Seasonal allergies    Sickle cell anemia (HCC)    TRAIT   Sickle cell trait (HCC)    Yeast infection     Past Surgical History:  Procedure Laterality Date   ABDOMINAL HYSTERECTOMY     CESAREAN SECTION  [phone]   x 3   EYE MUSCLE SURGERY     ROBOTIC ASSISTED TOTAL HYSTERECTOMY Bilateral 07/10/2012   Procedure: ROBOTIC ASSISTED TOTAL HYSTERECTOMY WITH BILATERAL SALPINGECTOMY;  Surgeon: Esmeralda Arthur, MD;  Location: [facility];  Service: Gynecology;  Laterality: Bilateral;   TONSILLECTOMY AND ADENOIDECTOMY     TUBAL LIGATION     WISDOM TOOTH EXTRACTION      Family History  Problem Relation Age of Onset   Kidney failure Mother    Hypertension Mother    Stroke Mother    Anemia Mother    Diabetes Sister    Diabetes Brother     Social History   Tobacco Use   Smoking status: Former    Types: Cigars    Quit date: 02/26/2010    Years since quitting: 12.8   Smokeless tobacco: Never  Substance Use Topics   Alcohol use: Yes    Alcohol/week: 3.0 standard drinks of alcohol    Types: 1 Glasses of wine,  2 Standard drinks or equivalent per week    Comment: weekends    Subjective:   Presents today as a new patient; notes her former PCP is retiring;  1) Concerned she may have a UTI; more frequent urination for a few days;  2) Concerned about left hip/ back pain radiating into lower extremity; symptoms "going on for a while"; symptoms are intermittent; not having pain today; 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  Head: Normocephalic and atraumatic  Eyes: Sclera and conjunctiva clear; pupils round and reactive to light; extraocular movements intact  Ears: External normal; canals clear; tympanic membranes normal  Oropharynx: Pink, supple. No suspicious lesions  Neck: Supple without thyromegaly, adenopathy  Lungs: Respirations unlabored; clear to auscultation bilaterally without wheeze, rales, rhonchi  CVS exam: normal rate and regular rhythm.  Musculoskeletal: No deformities; no active joint inflammation  Extremities: No edema, cyanosis, clubbing  Vessels: Symmetric bilaterally  Neurologic:  Alert and oriented; speech intact; face symmetrical; moves all extremities well; CNII-XII intact without focal deficit   Assessment:  1. Pre-diabetes   2. Urinary frequency   3. Pain of left hip   4. Low back pain with left-sided sciatica, unspecified back pain laterality, unspecified chronicity   5. BMI 35.0-35.9,adult     Plan:  Check CBC, CMP, hgba1c today;  Check urine culture;  3. & 4. Update lumbar X-ray/ left hip x-ray; to consider PT- patient is already scheduled to go to [facility] later this week; follow up to be determined;  5.  Discussed referral to [facility]; patient defers at  this time and is planning to work on increased exercise and healthy eating; will let us know how we may be able to support her needs.   No follow-ups on file.  Orders Placed This Encounter  Procedures   Urine Culture   DG Lumbar Spine Complete    Standing Status:   Future    Number of Occurrences:   1    Standing Expiration Date:   01/15/2024    Order Specific Question:   Reason for Exam (SYMPTOM  OR DIAGNOSIS REQUIRED)    Answer:   low back pain    Order Specific Question:   Is patient pregnant?    Answer:   No    Order Specific Question:   Preferred imaging location?    Answer:   [facility]   DG HIP UNILAT W OR W/O PELVIS 2-3 VIEWS LEFT    Standing Status:   Future    Number of Occurrences:   1    Standing Expiration Date:   07/15/2023    Order Specific Question:   Reason for Exam (SYMPTOM  OR DIAGNOSIS REQUIRED)    Answer:   hip pain    Order Specific Question:   Is the patient pregnant?    Answer:   No    Order Specific Question:   Preferred imaging location?    Answer:   [facility]   CBC with Differential/Platelet   Comp Met (CMET)   Hemoglobin A1c    Requested Prescriptions    No prescriptions requested or ordered in this encounter

## 2023-01-16 LAB — CBC WITH DIFFERENTIAL/PLATELET
Basophils Absolute: 0.1 10*3/uL (ref 0.0–0.1)
Basophils Relative: 1.1 % (ref 0.0–3.0)
Eosinophils Absolute: 0.3 10*3/uL (ref 0.0–0.7)
Eosinophils Relative: 5.9 % — ABNORMAL HIGH (ref 0.0–5.0)
HCT: 42.9 % (ref 36.0–46.0)
Hemoglobin: 14.1 g/dL (ref 12.0–15.0)
Lymphocytes Relative: 41.1 % (ref 12.0–46.0)
Lymphs Abs: 2 10*3/uL (ref 0.7–4.0)
MCHC: 32.8 g/dL (ref 30.0–36.0)
MCV: 88.3 fL (ref 78.0–100.0)
Monocytes Absolute: 0.4 10*3/uL (ref 0.1–1.0)
Monocytes Relative: 8 % (ref 3.0–12.0)
Neutro Abs: 2.1 10*3/uL (ref 1.4–7.7)
Neutrophils Relative %: 43.9 % (ref 43.0–77.0)
Platelets: 383 10*3/uL (ref 150.0–400.0)
RBC: 4.86 Mil/uL (ref 3.87–5.11)
RDW: 13.5 % (ref 11.5–15.5)
WBC: 4.8 10*3/uL (ref 4.0–10.5)

## 2023-01-16 LAB — COMPREHENSIVE METABOLIC PANEL
ALT: 16 U/L (ref 0–35)
AST: 17 U/L (ref 0–37)
Albumin: 4.6 g/dL (ref 3.5–5.2)
Alkaline Phosphatase: 90 U/L (ref 39–117)
BUN: 12 mg/dL (ref 6–23)
CO2: 27 meq/L (ref 19–32)
Calcium: 9.6 mg/dL (ref 8.4–10.5)
Chloride: 105 meq/L (ref 96–112)
Creatinine, Ser: 0.83 mg/dL (ref 0.40–1.20)
GFR: 79.63 mL/min (ref >60.00)
Glucose, Bld: 139 mg/dL — ABNORMAL HIGH (ref 70–99)
Potassium: 4.2 meq/L (ref 3.5–5.1)
Sodium: 140 meq/L (ref 135–145)
Total Bilirubin: 0.5 mg/dL (ref 0.2–1.2)
Total Protein: 7.6 g/dL (ref 6.0–8.3)

## 2023-01-16 LAB — HEMOGLOBIN A1C: Hgb A1c MFr Bld: 6.5 % (ref 4.6–6.5)

## 2023-01-17 LAB — URINE CULTURE
MICRO NUMBER:: 15750564
SPECIMEN QUALITY:: ADEQUATE

## 2023-05-27 ENCOUNTER — Telehealth: Payer: Self-pay | Admitting: *Deleted

## 2023-05-27 NOTE — Telephone Encounter (Signed)
 Pt has lab appt 05/31/23.  There are no future orders in EPIC.  Please place future orders if appropriate.

## 2023-05-28 ENCOUNTER — Other Ambulatory Visit: Payer: Self-pay | Admitting: Family

## 2023-05-28 DIAGNOSIS — R7309 Other abnormal glucose: Secondary | ICD-10-CM

## 2023-05-31 ENCOUNTER — Other Ambulatory Visit: Payer: Self-pay

## 2023-06-17 ENCOUNTER — Other Ambulatory Visit: Payer: Self-pay

## 2023-06-20 ENCOUNTER — Other Ambulatory Visit (INDEPENDENT_AMBULATORY_CARE_PROVIDER_SITE_OTHER): Payer: Self-pay

## 2023-06-20 DIAGNOSIS — R7309 Other abnormal glucose: Secondary | ICD-10-CM | POA: Diagnosis not present

## 2023-06-20 LAB — COMPREHENSIVE METABOLIC PANEL WITH GFR
ALT: 13 U/L (ref 0–35)
AST: 14 U/L (ref 0–37)
Albumin: 4.1 g/dL (ref 3.5–5.2)
Alkaline Phosphatase: 80 U/L (ref 39–117)
BUN: 12 mg/dL (ref 6–23)
CO2: 27 meq/L (ref 19–32)
Calcium: 9.3 mg/dL (ref 8.4–10.5)
Chloride: 105 meq/L (ref 96–112)
Creatinine, Ser: 0.74 mg/dL (ref 0.40–1.20)
GFR: 91.12 mL/min (ref >60.00)
Glucose, Bld: 172 mg/dL — ABNORMAL HIGH (ref 70–99)
Potassium: 4.7 meq/L (ref 3.5–5.1)
Sodium: 139 meq/L (ref 135–145)
Total Bilirubin: 0.3 mg/dL (ref 0.2–1.2)
Total Protein: 6.9 g/dL (ref 6.0–8.3)

## 2023-06-20 LAB — HEMOGLOBIN A1C: Hgb A1c MFr Bld: 6.5 % (ref 4.6–6.5)

## 2023-09-13 ENCOUNTER — Ambulatory Visit: Admitting: Family

## 2023-09-20 ENCOUNTER — Ambulatory Visit: Admitting: Family

## 2023-12-18 ENCOUNTER — Telehealth: Payer: Self-pay

## 2023-12-18 NOTE — Telephone Encounter (Signed)
 Copied from CRM [id number]. Topic: Appointments - Transfer of Care >> Dec 18, 2023 10:18 AM Alfonso ORN wrote: Pt is requesting to transfer FROM: Sheri Shepard Pt is requesting to transfer TO: Wheeler Harlene CROME Reason for requested transfer: Sheri no longer with practice  It is the responsibility of the team the patient would like to transfer to (Dr. Wheeler) to reach out to the patient if for any reason this transfer is not acceptable.

## 2023-12-19 NOTE — Telephone Encounter (Signed)
 Pt has scheduled OV 03/20/2024.

## 2024-03-20 ENCOUNTER — Ambulatory Visit: Payer: Self-pay | Admitting: Student

## 2024-03-20 ENCOUNTER — Encounter: Payer: Self-pay | Admitting: Student

## 2024-03-20 DIAGNOSIS — Z91199 Patient's noncompliance with other medical treatment and regimen due to unspecified reason: Secondary | ICD-10-CM | POA: Insufficient documentation

## 2024-03-20 NOTE — Progress Notes (Signed)
 Error-Pt no call no show
